# Patient Record
Sex: Male | Born: 1950 | Race: White | Hispanic: No | Marital: Married | Smoking: Never smoker
Health system: Southern US, Community
[De-identification: ages and names within clinical notes are randomized; demographics above are authoritative.]

## PROBLEM LIST (undated history)

## (undated) DIAGNOSIS — K219 Gastro-esophageal reflux disease without esophagitis: Secondary | ICD-10-CM

## (undated) DIAGNOSIS — R51 Headache: Secondary | ICD-10-CM

## (undated) DIAGNOSIS — C801 Malignant (primary) neoplasm, unspecified: Secondary | ICD-10-CM

## (undated) DIAGNOSIS — F329 Major depressive disorder, single episode, unspecified: Secondary | ICD-10-CM

## (undated) DIAGNOSIS — M1990 Unspecified osteoarthritis, unspecified site: Secondary | ICD-10-CM

## (undated) DIAGNOSIS — Z8719 Personal history of other diseases of the digestive system: Secondary | ICD-10-CM

## (undated) DIAGNOSIS — E7800 Pure hypercholesterolemia, unspecified: Secondary | ICD-10-CM

## (undated) DIAGNOSIS — F32A Depression, unspecified: Secondary | ICD-10-CM

## (undated) DIAGNOSIS — K589 Irritable bowel syndrome without diarrhea: Secondary | ICD-10-CM

## (undated) DIAGNOSIS — R519 Headache, unspecified: Secondary | ICD-10-CM

## (undated) DIAGNOSIS — I10 Essential (primary) hypertension: Secondary | ICD-10-CM

## (undated) HISTORY — DX: Irritable bowel syndrome, unspecified: K58.9

## (undated) HISTORY — PX: BACK SURGERY: SHX140

## (undated) HISTORY — DX: Depression, unspecified: F32.A

## (undated) HISTORY — PX: OTHER SURGICAL HISTORY: SHX169

## (undated) HISTORY — DX: Major depressive disorder, single episode, unspecified: F32.9

## (undated) HISTORY — DX: Essential (primary) hypertension: I10

## (undated) HISTORY — PX: SHOULDER SURGERY: SHX246

## (undated) HISTORY — PX: MOLE REMOVAL: SHX2046

## (undated) HISTORY — DX: Headache, unspecified: R51.9

## (undated) HISTORY — PX: CERVICAL DISC SURGERY: SHX588

## (undated) HISTORY — DX: Pure hypercholesterolemia, unspecified: E78.00

## (undated) HISTORY — PX: ANKLE FRACTURE SURGERY: SHX122

## (undated) HISTORY — PX: COLONOSCOPY W/ POLYPECTOMY: SHX1380

## (undated) HISTORY — DX: Headache: R51

---

## 2003-12-23 ENCOUNTER — Ambulatory Visit (HOSPITAL_BASED_OUTPATIENT_CLINIC_OR_DEPARTMENT_OTHER): Admission: RE | Admit: 2003-12-23 | Discharge: 2003-12-23 | Payer: Self-pay | Admitting: Orthopedic Surgery

## 2003-12-23 ENCOUNTER — Ambulatory Visit (HOSPITAL_COMMUNITY): Admission: RE | Admit: 2003-12-23 | Discharge: 2003-12-23 | Payer: Self-pay | Admitting: Orthopedic Surgery

## 2003-12-24 ENCOUNTER — Encounter (INDEPENDENT_AMBULATORY_CARE_PROVIDER_SITE_OTHER): Payer: Self-pay | Admitting: *Deleted

## 2007-06-11 ENCOUNTER — Ambulatory Visit (HOSPITAL_COMMUNITY): Admission: RE | Admit: 2007-06-11 | Discharge: 2007-06-11 | Payer: Self-pay | Admitting: Orthopaedic Surgery

## 2007-12-27 ENCOUNTER — Encounter (INDEPENDENT_AMBULATORY_CARE_PROVIDER_SITE_OTHER): Payer: Self-pay | Admitting: Orthopedic Surgery

## 2007-12-27 ENCOUNTER — Ambulatory Visit (HOSPITAL_BASED_OUTPATIENT_CLINIC_OR_DEPARTMENT_OTHER): Admission: RE | Admit: 2007-12-27 | Discharge: 2007-12-27 | Payer: Self-pay | Admitting: Orthopedic Surgery

## 2009-06-23 IMAGING — CR DG ORBITS FOR FOREIGN BODY
2 series · 2 of 2 positions shown · non-contrast
Comparison: None

CLINICAL DATA: Pre MRI.

ORBITS FOR FOREIGN BODY - 2 VIEW

[w waters * (1 of 2)]
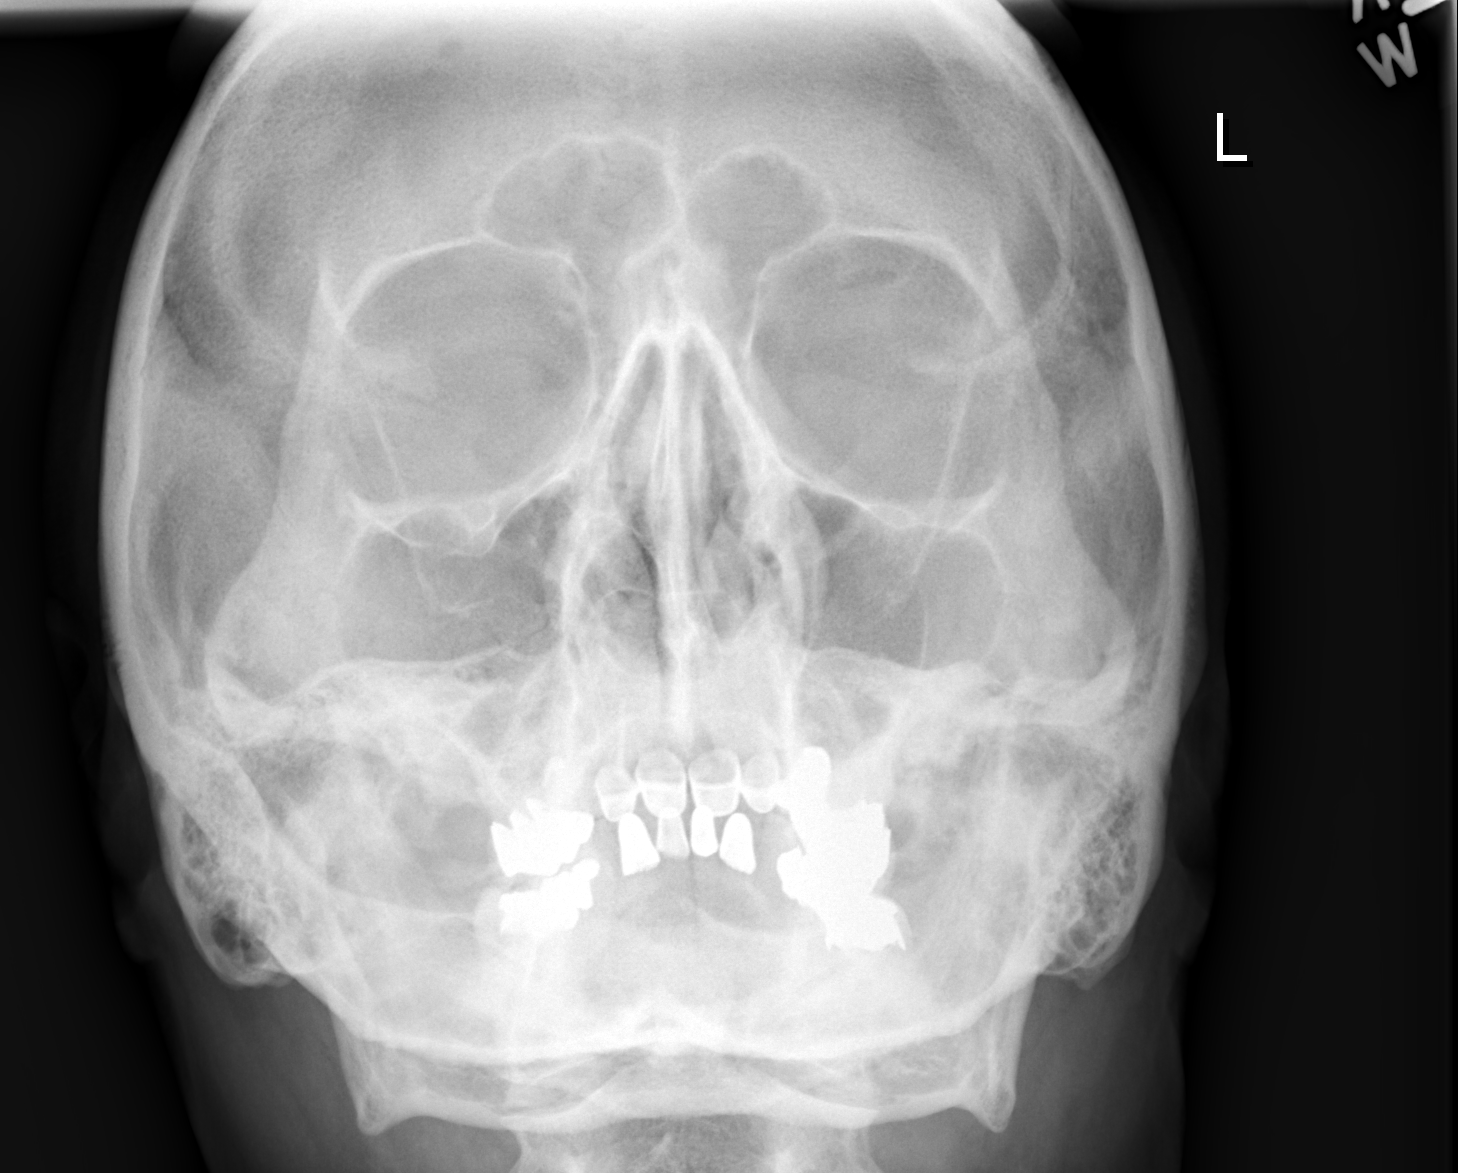

[w waters * (2 of 2)]
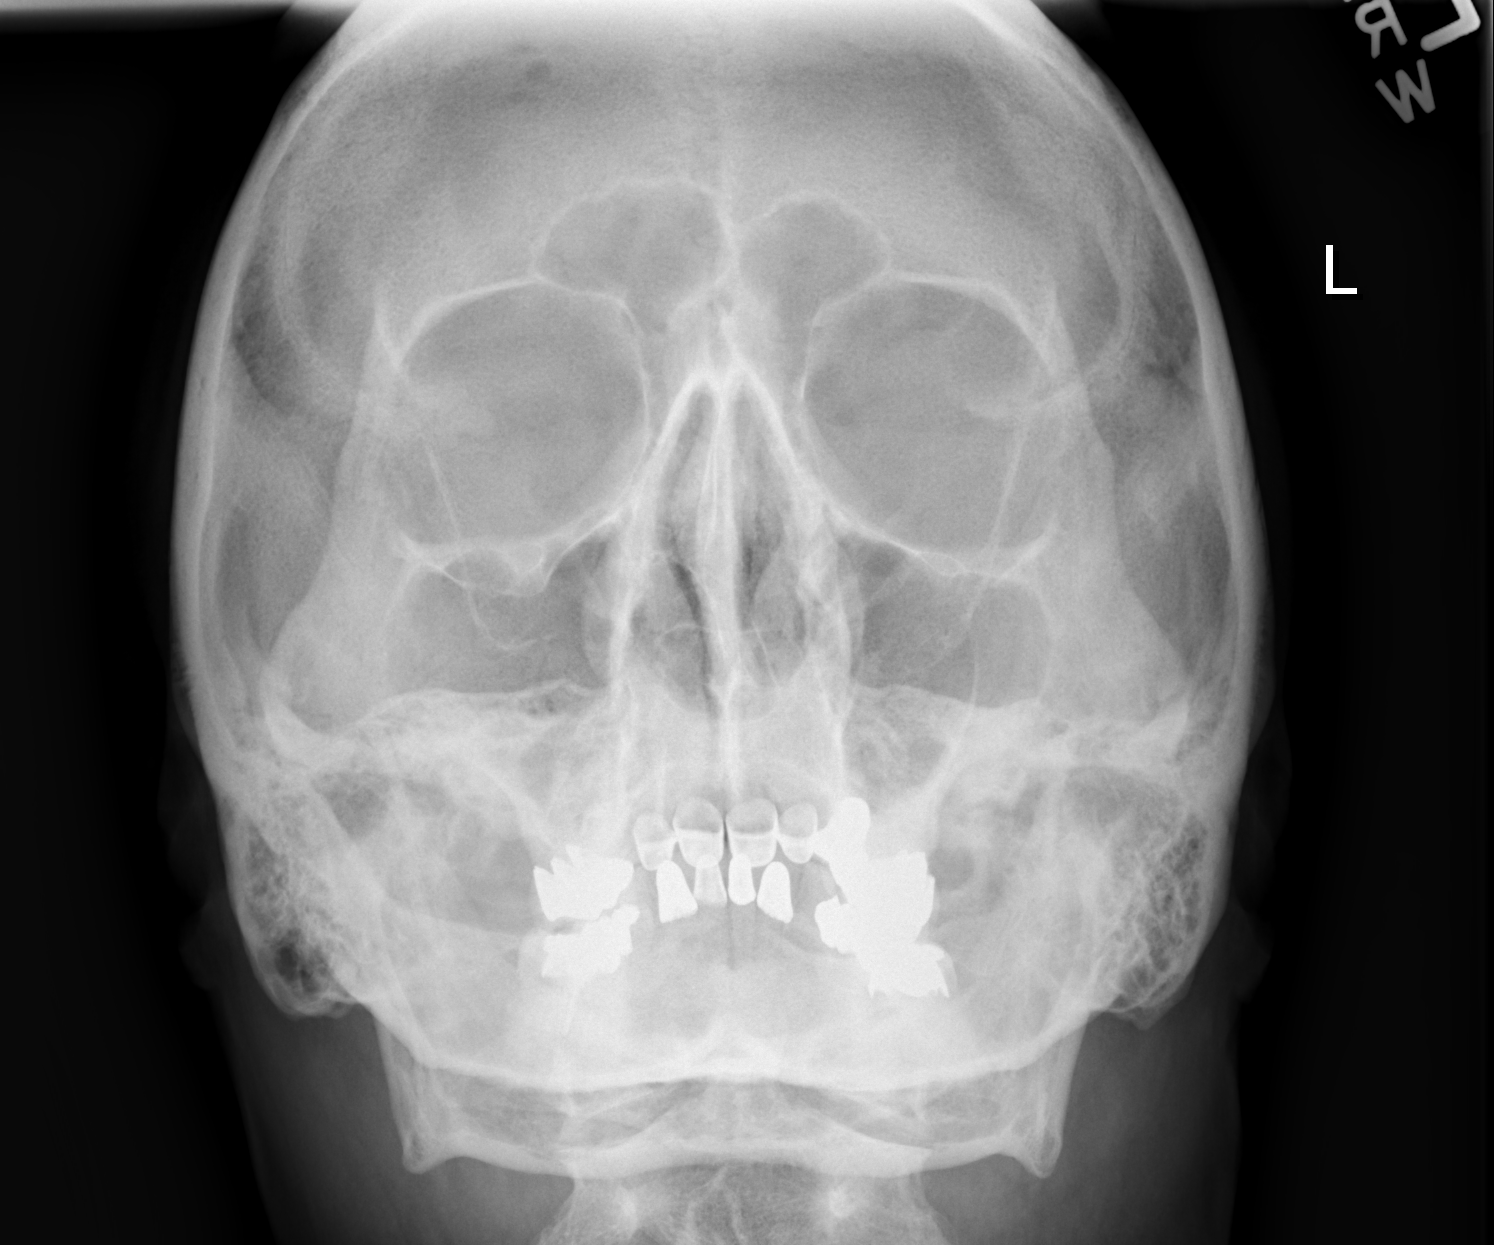

[2 of 2 positions shown; findings below may reference images not displayed]

FINDINGS: No metallic foreign bodies are seen overlying the globes.
The paranasal sinuses are clear.
IMPRESSION: 1.  Negative orbits for metallic foreign body.

## 2009-10-20 ENCOUNTER — Ambulatory Visit (HOSPITAL_BASED_OUTPATIENT_CLINIC_OR_DEPARTMENT_OTHER): Admission: RE | Admit: 2009-10-20 | Discharge: 2009-10-20 | Payer: Self-pay | Admitting: Orthopaedic Surgery

## 2010-04-08 LAB — POCT I-STAT, CHEM 8
BUN: 12 mg/dL (ref 6–23)
Calcium, Ion: 1.16 mmol/L (ref 1.12–1.32)
Creatinine, Ser: 1 mg/dL (ref 0.4–1.5)
Hemoglobin: 15.3 g/dL (ref 13.0–17.0)
Sodium: 141 mEq/L (ref 135–145)
TCO2: 27 mmol/L (ref 0–100)

## 2010-06-08 NOTE — Op Note (Signed)
NAME:  LEVAR, FAYSON NO.:  [id number]   MEDICAL RECORD NO.:  [id number]          PATIENT TYPE:  AMB   LOCATION:  DSC                          FACILITY:  [facility]   PHYSICIAN:  Katy Fitch. Sypher, M.D. 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   POSTOPERATIVE DIAGNOSES:  A 95% loss of hyaline cartilage on right long  finger metacarpal head and 90% loss of hyaline articular cartilage on  base articular surface of proximal phalanx with multiple marginal  osteophytes, evidencing end-stage degenerative arthritis, right long  finger metacarpophalangeal joint.   OPERATION:  Implant arthroplasty of right long finger  metacarpophalangeal joint utilizing a size 5 [employer] flexible  hinge implant arthroplasty and formal reconstruction of the radial  collateral ligament with FiberWire suture through drill holes in the  metacarpal neck.   OPERATING SURGEON:  Katy Fitch. Sypher, MD   ASSISTANT:  Jonni Sanger, PA-C (Mr. Dasnoit's assistance with  retraction and positioning of the finger was invaluable during this  procedure and facilitated a safe accomplishment of this arthroplasty  ensuring optimum results).   ANESTHESIA:  General by LMA supplemented by 2% lidocaine field block.   SUPERVISING ANESTHESIOLOGIST:  Guadalupe Maple, MD   INDICATIONS:  Barbera Setters. Osment is a 60 year old right-hand dominant  retired gentleman who still remains very active with a second career.  He has had a history of progressive right hand pain and limitation of  grasp.  Clinical examination revealed signs of swelling in the region of  his right  long finger metacarpophalangeal joint and detailed examination  of his range of motion revealed loss of hyperextension of the right long  finger MP joint of 10 degrees versus 40 degrees of the adjacent index  and long fingers, and loss of further flexion at 60 degrees compared  with the adjacent index and ring fingers of 90 degrees.  Plain films  demonstrated narrowing of the joint space at the right long finger  metacarpophalangeal joint and a significant marginal osteophyte forming  on the dorsal ulnar aspect of the metacarpal head and the ulnar aspect  of the proximal phalangeal base.   Mr. Lutze had tried over-the-counter analgesics, steroid injection,  activity modification, and topical agents all without relief.   He sought an upper extremity orthopedic consult in the summer of 2009  and was advised to consider implant arthroplasty when his pain became  intolerable.   He has reached a stage where his hand function has been significantly  compromised; therefore, he presents for implant arthroplasty of the  right long finger at this time.   Preoperatively, the surgery, aftercare, potential risks and benefits  were explained in detail.  Questions were invited and answered in detail  both in the office and in the holding area.  After informed consent,  he  is brought to the operating room at this time.   PROCEDURE:  Barbera Setters. Maher was brought to the operating room and  placed in supine position upon the operating table.  Preoperatively, Dr.  Noreene Larsson had had an anesthesia consult and recommended proceeding with  general anesthesia by LMA technique.   He was brought to room 8 of the [facility], placed in supine  position upon the operating table, and under Dr. Morley Kos direct  supervision, general anesthesia by LMA technique induced.   Ancef 1 g was administered as an IV prophylactic antibiotic.  The right  upper extremity was prepped with Betadine soap solution and  sterilely  draped.  Following exsanguination of the right arm with an Esmarch  bandage, arterial tourniquet was inflated to 220 mmHg.  After an  operating room time-out, we proceeded with the reconstructive surgery.   A curvilinear incision was fashioned directly over the right long finger  metacarpophalangeal joint exposing the extensor mechanism.  The extensor  was split in the midline and the capsule of the metacarpophalangeal  joint carefully incised with a 64 Beaver blade.  Full-thickness capsular  flaps were developed radially and ulnarly.  The ulnar collateral  ligament was elevated off the neck of the metacarpal and the radial  collateral ligament was undermined with a sharp osteotome, exposing the  metacarpal neck.   After blunt baby Bennett retractors were placed, the metacarpal head and  neck were resected with an oscillating saw.  Care was taken to build in  approximately 3 degrees of radial deviation.   The metacarpal head was removed and found to have a 95% loss of hyaline  articular cartilage.   The metacarpophalangeal joint was cleared with fine rongeurs including  complete synovectomy and removal of all debris.  The base of the  proximal phalanx was inspected and found to have 90% hyaline cartilage  loss.   After irrigation, the intermedullary canals of the metacarpal and  proximal phalanx were prepared utilizing a sharp bone awl with  radiographic control followed by use of a Swanson power reamer and hand  rasps.  We sequentially prepared the bones to accept a #5 [facility]  flexible hinge implant.   The radial collateral ligament was gathered with a 3-0 FiberWire suture  that was brought through drill holes in the neck of the metacarpal.   A #5 [facility] Silastic hinge implant was placed with no-touch  technique followed by anatomic repair of the capsule utilizing the  through bone sutures for a proximal anchor and multiple interrupted  sutures of 3-0  Ethibond with knots buried.   After irrigation, the extensor mechanism was reconstructed with multiple  figure-of-eight sutures of 3-0 Ethibond with knots buried.   AP and lateral C-arm images were obtained documenting very satisfactory  reconstruction of the metacarpophalangeal joint.  Range of motion was  hyperextension 30 and further flexion 90.   The skin was repaired with segmental intradermal 3-0 Prolene and Steri-  Strips.  Lidocaine 2% was infiltrated as postop analgesia and 30 mg of  Toradol was administered IV as an analgesic by the anesthesia team.   Mr. Keeling was placed in compressive dressing with dorsal and palmar  plaster sandwich splints maintaining the hand in the safe position.  There were no apparent complications.   He was awakened from general anesthesia and transferred to recovery room  with stable vital signs.  Total tourniquet time was 1 hour and 4 minutes  at 220  mmHg.  There were no apparent complications.   For aftercare, he is provided prescriptions of Dilaudid 2 mg 1 or 2  tablets p.o. q.4-6 h. p.r.n. pain, 30 tablets without refill.  Also,  Keflex 500 mg 1 p.o. q.8 h. x4 days as a prophylactic antibiotic.   We will see him back for followup in the office in 1 week for dressing  change, x-ray, and advancement to dynamic splint.      Katy Fitch Sypher, M.D.  Electronically Signed     RVS/MEDQ  D:  12/27/2007  T:  12/28/2007  Job:  [id number]

## 2010-06-10 ENCOUNTER — Other Ambulatory Visit: Payer: Self-pay | Admitting: Family Medicine

## 2010-06-11 NOTE — Op Note (Signed)
NAME:  Joshua Alvarado, Joshua Alvarado NO.:  [id number]   MEDICAL RECORD NO.:  [id number]          PATIENT TYPE:  AMB   LOCATION:  DSC                          FACILITY:  [facility]   PHYSICIAN:  Cindee Salt, M.D.       DATE OF BIRTH:  04-11-1950   DATE OF PROCEDURE:  12/23/2003  DATE OF DISCHARGE:                                 OPERATIVE REPORT   PREOPERATIVE DIAGNOSIS:  Lesion right index finger.   POSTOPERATIVE DIAGNOSIS:  Lesion right index finger.   OPERATION:  Incision and drainage, biopsy of lesion, right index finger.   SURGEON:  Cindee Salt, M.D.   ASSISTANTCarolyne Fiscal   ANESTHESIA:  Forearm base, IV regional.   HISTORY:  The patient is a 60 year old male with a history of a lesion on  the dorsal aspect of his right index finger.  This has been painful for him.  He has hit it on several occasions.  X-rays reveal degenerative arthritis,  questionable cyt is present.   DESCRIPTION OF PROCEDURE:  The patient was brought to the operating room  where a forearm based IV regional anesthetic was carried out without  difficulty.  He was prepped using DuraPrep in a supine position with the  right arm free.  A curvilinear incision was made based over the distal  aspect of the mass and carried down through the subcutaneous tissue.  Multiloculated skin lesion only was identified.  Cultures were then taken.  No cyst was identified.  The joint was not involved.  A biopsy of the skin  was taken, cultures were sent for aerobic, anaerobic, fungal and acid fast.  The wound was irrigated.  The skin was closed with interrupted 5-0 nylon  sutures.  A sterile compressive dressing and splint to the finger were  applied.  The patient tolerated the procedure well.  She is discharged home  to return to the [facility] in one week on Vicodin and  Doxycycline.       GK/MEDQ  D:  12/23/2003  T:  12/23/2003  Job:  [id number]

## 2010-06-29 ENCOUNTER — Other Ambulatory Visit: Payer: Self-pay | Admitting: Family Medicine

## 2010-06-29 ENCOUNTER — Ambulatory Visit
Admission: RE | Admit: 2010-06-29 | Discharge: 2010-06-29 | Disposition: A | Discharge status: Home / Self Care | Payer: BC Managed Care – PPO | Source: Ambulatory Visit | Attending: Family Medicine | Admitting: Family Medicine

## 2010-10-29 LAB — BASIC METABOLIC PANEL
BUN: 10 mg/dL (ref 6–23)
CO2: 28 mEq/L (ref 19–32)
Calcium: 9.2 mg/dL (ref 8.4–10.5)
Chloride: 101 mEq/L (ref 96–112)
Creatinine, Ser: 1.12 mg/dL (ref 0.4–1.5)
Glucose, Bld: 175 mg/dL — ABNORMAL HIGH (ref 70–99)

## 2012-05-30 ENCOUNTER — Other Ambulatory Visit: Payer: Self-pay | Admitting: Dermatology

## 2013-07-10 ENCOUNTER — Other Ambulatory Visit: Payer: Self-pay | Admitting: Gastroenterology

## 2016-06-29 ENCOUNTER — Other Ambulatory Visit: Payer: Self-pay | Admitting: Specialist

## 2016-06-29 DIAGNOSIS — R519 Headache, unspecified: Secondary | ICD-10-CM

## 2016-06-29 DIAGNOSIS — R51 Headache: Principal | ICD-10-CM

## 2016-07-11 ENCOUNTER — Ambulatory Visit
Admission: RE | Admit: 2016-07-11 | Discharge: 2016-07-11 | Disposition: A | Discharge status: Home / Self Care | Payer: Medicare Other | Source: Ambulatory Visit | Attending: Specialist | Admitting: Specialist

## 2016-07-11 DIAGNOSIS — R51 Headache: Principal | ICD-10-CM

## 2016-07-11 DIAGNOSIS — R519 Headache, unspecified: Secondary | ICD-10-CM

## 2016-08-30 ENCOUNTER — Ambulatory Visit
Admission: RE | Admit: 2016-08-30 | Discharge: 2016-08-30 | Disposition: A | Discharge status: Home / Self Care | Payer: Medicare Other | Source: Ambulatory Visit | Attending: Specialist | Admitting: Specialist

## 2016-08-30 MED ORDER — GADOBENATE DIMEGLUMINE 529 MG/ML IV SOLN
20.0000 mL | Freq: Once | INTRAVENOUS | Status: AC | PRN
  Administered 2016-08-30: 20 mL via INTRAVENOUS

## 2016-10-21 ENCOUNTER — Other Ambulatory Visit: Payer: Self-pay | Admitting: Specialist

## 2016-10-21 DIAGNOSIS — R519 Headache, unspecified: Secondary | ICD-10-CM

## 2016-10-21 DIAGNOSIS — R51 Headache: Principal | ICD-10-CM

## 2016-11-03 ENCOUNTER — Ambulatory Visit
Admission: RE | Admit: 2016-11-03 | Discharge: 2016-11-03 | Disposition: A | Discharge status: Home / Self Care | Payer: Medicare Other | Source: Ambulatory Visit | Attending: Specialist | Admitting: Specialist

## 2016-11-03 DIAGNOSIS — R519 Headache, unspecified: Secondary | ICD-10-CM

## 2016-11-03 DIAGNOSIS — R51 Headache: Principal | ICD-10-CM

## 2016-11-03 NOTE — Progress Notes (Signed)
One SST tube of blood drawn from left [facility] space for LP labs; site unremarkable.

## 2016-11-03 NOTE — Discharge Instructions (Signed)

## 2016-11-08 ENCOUNTER — Encounter: Payer: Self-pay | Admitting: Neurology

## 2016-11-09 LAB — CSF CELL COUNT WITH DIFFERENTIAL
RBC COUNT CSF: 1250 {cells}/uL — AB (ref 0–10)
WBC CSF: 2 {cells}/uL (ref 0–5)

## 2016-11-09 LAB — B. BURGDORFI ANTIBODIES, CSF: LYME AB: NEGATIVE

## 2016-11-09 LAB — CRYPTOCOCCAL AG, LTX SCR RFLX TITER
CRYPTOCOCCAL AG SCREEN: NOT DETECTED
MICRO NUMBER: 81134602
SPECIMEN QUALITY: ADEQUATE

## 2016-11-09 LAB — ANGIOTENSIN CONVERTING ENZYME, CSF: ACE, CSF: 12 U/L (ref <=15)

## 2016-11-09 LAB — GLUCOSE, CSF: GLUCOSE CSF: 78 mg/dL (ref 40–80)

## 2016-11-09 LAB — HERPES SIMPLEX VIRUS 1/2 (IGG), CSF: HSV 1 IGG INDEX: 1.12 — AB

## 2016-11-09 LAB — VDRL, CSF: VDRL Quant, CSF: NONREACTIVE

## 2016-11-09 LAB — PROTEIN, CSF: Total Protein, CSF: 99 mg/dL — ABNORMAL HIGH (ref 15–60)

## 2016-12-22 LAB — CSF CULTURE W GRAM STAIN
MICRO NUMBER:: 81134604
SPECIMEN QUALITY:: ADEQUATE

## 2016-12-22 LAB — MYCOBACTERIA,CULT W/FLUOROCHROME SMEAR
MICRO NUMBER:: 81134603
SMEAR: NONE SEEN
SPECIMEN QUALITY:: ADEQUATE

## 2016-12-22 LAB — CSF CULTURE: RESULT: NO GROWTH

## 2017-02-01 ENCOUNTER — Ambulatory Visit: Payer: Medicare Other | Admitting: Neurology

## 2017-02-01 ENCOUNTER — Encounter: Payer: Self-pay | Admitting: Neurology

## 2017-02-01 VITALS — BP 110/80 | HR 79 | Ht 71.5 in | Wt 214.8 lb

## 2017-02-01 DIAGNOSIS — G4452 New daily persistent headache (NDPH): Secondary | ICD-10-CM | POA: Diagnosis not present

## 2017-02-01 DIAGNOSIS — K589 Irritable bowel syndrome without diarrhea: Secondary | ICD-10-CM | POA: Insufficient documentation

## 2017-02-01 DIAGNOSIS — G4483 Primary cough headache: Secondary | ICD-10-CM

## 2017-02-01 DIAGNOSIS — I10 Essential (primary) hypertension: Secondary | ICD-10-CM | POA: Insufficient documentation

## 2017-02-01 DIAGNOSIS — E7800 Pure hypercholesterolemia, unspecified: Secondary | ICD-10-CM | POA: Insufficient documentation

## 2017-02-01 MED ORDER — TOPIRAMATE 25 MG PO TABS
25.0000 mg | ORAL_TABLET | Freq: Two times a day (BID) | ORAL | 2 refills | Status: DC
Start: 2017-02-01 — End: 2017-05-03

## 2017-02-01 NOTE — Patient Instructions (Signed)
1.  I would like you to stop zonisamide.  It is a low dose, so I think it is okay to just stop it. 2.  Instead, start topiramate 25mg  twice daily.    Possible side effects include: impaired thinking, sedation, paresthesias (numbness and tingling) and weight loss.  It may cause dehydration and there is a small risk for kidney stones, so make sure to stay hydrated with water during the day.  There is also a very small risk for glaucoma, so if you notice any change in your vision while taking this medication, see an ophthalmologist.    If you are feeling better at the end of the month, refill the prescription. If not, contact me before you refill it and I will send in a prescription for a higher dose.  3.  Limit use of ibuprofen to no more than 2 days out of the week to prevent rebound headache. 4.  Check CTA of head and neck. 5.  Follow up in 3 months.

## 2017-02-01 NOTE — Progress Notes (Signed)
NEUROLOGY CONSULTATION NOTE  Joshua Alvarado MRN: [id number] DOB: 1950/08/15  Referring provider: Lennie Odor, PA-C Primary care provider: Lennie Odor, PA-C  Reason for consult:  headache  HISTORY OF PRESENT ILLNESS: Joshua Alvarado is a 67 year old male with hypertension, hypercholesterolemia, impaired glucose tolerance, depression and IBS who presents for headache.  He is accompanied by his wife who supplements history.  Headaches started on September 22 or 23, 2017.  He denies any preceding event that may have triggered this headache.  Initially, it was right periorbital that subsequently involved the entire head, radiating from the back to front.  He has a constant dull holocephalic headache.  Bending over makes it worse.  There is no neck pain.  However, he has severe intermittent headaches.  They occasionally occur spontaneously, but almost always are triggered by coughing or sneezing.  When it occurs, he needs to lay down and it resolves in 1 to 2 hours.  He has associated photophobia but no nausea, vomiting, phonophobia or visual disturbance.  It occurs approximately 5 to 6 days a month.  Headache had actually resolved for 2 days following a lumbar puncture.  Past NSAIDS:  Indomethacin trial was ineffective. Past analgesics:  acetaminophen, BC/Goody powder, Excedrin Past abortive triptans:  no Past muscle relaxants:  cyclobenzaprine Past anti-emetic:  no Past antihypertensive medications: metoprolol Past antidepressant medications:  bupropion, escitalopram Past anticonvulsant medications:  no Past vitamins/Herbal/Supplements:  no Past antihistamines/decongestants:  no Other past therapies:  trigger point injections and nerve blocks were ineffective.  Current NSAIDS:  Ibuprofen (takes more than 2 days a week). Current analgesics:  no Current triptans:  no Current anti-emetic:  no Current muscle relaxants:  no Current anti-anxiolytic:  no Current sleep aide:   no Current Antihypertensive medications:  Lisinopril-HCTZ Current Antidepressant medications:  no Current Anticonvulsant medications:  zonisamide 200mg  Current Vitamins/Herbal/Supplements:  no Current Antihistamines/Decongestants:  no Other therapy:  no  Caffeine:  Decaf tea Alcohol:  occasionally Smoker:  no Diet:  Hydrates with water.   Exercise:  Not routine Depression/anxiety:  Some depression related to his headaches Sleep hygiene:  Ok Occasional tension type headaches prior to this, but nothing significant. Family history of headache:  no  He underwent headache workup in 2018: MRI of brain with and without contrast from 08/30/16 was personally reviewed and demonstrated scattered nonspecific hyperintense foci in the cerebral white matter.  He underwent lumbar puncture on 11/03/16 to assess for low intracranial pressure.  Opening pressure of 13.5 cm H2O and closing pressure of 7 cm H2O.  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  PAST MEDICAL HISTORY: Past Medical History:  Diagnosis Date  . Depression   . Headache   . Hypercholesteremia   . Hypertension   . Irritable bowel syndrome     PAST SURGICAL HISTORY: None  MEDICATIONS: Current Outpatient Medications on File Prior to Visit  Medication Sig Dispense Refill  . cholestyramine (QUESTRAN) 4 GM/DOSE powder Take 4 g by mouth 2 (two) times daily with a meal.    . Eluxadoline (  VIBERZI) 75 MG TABS Take 1 tablet by mouth 2 (two) times daily.    Marland Kitchen ezetimibe (ZETIA) 10 MG tablet Take 10  mg by mouth daily.    . sildenafil (REVATIO) 20 MG tablet Take 20 mg by mouth 5 (five) times daily as needed.    Marland Kitchen lisinopril-hydrochlorothiazide (PRINZIDE,ZESTORETIC) 20-25 MG tablet Take 1 tablet by mouth daily.     No current facility-administered medications on file prior to visit.     ALLERGIES: Allergies  Allergen Reactions  . Crestor [Rosuvastatin Calcium]     diarrhea  . Lipitor [Atorvastatin]     Elevated liver enzymes  . Zocor [Simvastatin]     Stomach cramps    FAMILY HISTORY: Family History  Problem Relation Age of Onset  . Cancer Mother   . Parkinson's disease Father     SOCIAL HISTORY: Social History   Socioeconomic History  . Marital status: Married    Spouse name: Not on file  . Number of children: 2  . Years of education: Not on file  . Highest education level: 12th grade  Social Needs  . Financial resource strain: Not on file  . Food insecurity - worry: Not on file  . Food insecurity - inability: Not on file  . Transportation needs - medical: Not on file  . Transportation needs - non-medical: Not on file  Occupational History  . Occupation: retired    Comment: [employer]  Tobacco Use  . Smoking status: Never Smoker  . Smokeless tobacco: Former Systems developer    Types: Chew  Substance and Sexual Activity  . Alcohol use: Yes  . Drug use: No  . Sexual activity: Not on file  Other Topics Concern  . Not on file  Social History Narrative   Married, lives with wife in a one story home.     REVIEW OF SYSTEMS: Constitutional: No fevers, chills, or sweats, no generalized fatigue, change in appetite Eyes: No visual changes, double vision, eye pain Ear, nose and throat: No hearing loss, ear pain, nasal congestion, sore throat Cardiovascular: No chest pain, palpitations Respiratory:  No shortness of breath at rest or with exertion, wheezes GastrointestinaI: No nausea, vomiting, diarrhea, abdominal pain, fecal incontinence Genitourinary:  No dysuria, urinary  retention or frequency Musculoskeletal:  No neck pain, back pain Integumentary: No rash, pruritus, skin lesions Neurological: as above Psychiatric: No depression, insomnia, anxiety Endocrine: No palpitations, fatigue, diaphoresis, mood swings, change in appetite, change in weight, increased thirst Hematologic/Lymphatic:  No purpura, petechiae. Allergic/Immunologic: no itchy/runny eyes, nasal congestion, recent allergic reactions, rashes  PHYSICAL EXAM: Vitals:   02/01/17 1309  BP: 110/80  Pulse: 79  SpO2: 97%   General: No acute distress.  Patient appears well-groomed.  Head:  Normocephalic/atraumatic Eyes:  fundi examined but not visualized Neck: supple, no paraspinal tenderness, full range of motion Back: No paraspinal tenderness Heart: regular rate and rhythm Lungs: Clear to auscultation bilaterally. Vascular: No carotid bruits. Neurological Exam: Mental status: alert and oriented to person, place, and time, recent and remote memory intact, fund of knowledge intact, attention and concentration intact, speech fluent and not dysarthric, language intact. Cranial nerves: CN I: not tested CN II: pupils equal, round and reactive to light, visual fields intact CN III, IV, VI:  full range of motion, no nystagmus, no ptosis CN V: facial sensation intact CN VII: upper and lower face symmetric CN VIII: hearing intact CN IX, X: gag intact, uvula midline CN XI: sternocleidomastoid and trapezius muscles intact CN XII: tongue midline  Bulk & Tone: normal, no fasciculations. Motor:  5/5 throughout  Sensation: temperature and vibration sensation intact. Deep Tendon Reflexes:  2+ throughout, toes downgoing.  Finger to nose testing:  Without dysmetria.  Heel to shin:  Without dysmetria.  Gait:  Normal station and stride.  Able to turn and tandem walk. Romberg negative.  IMPRESSION: 1.  Primary cough headache.  Resolution of headache following LP is suggestive of this diagnosis as  well. 2.  New daily persistent headache, may be complicated by medication overuse.  PLAN: 1.  I will switch him from zonisamide to topiramate, starting at 25mg  twice daily.  We can titrate up in 4 weeks if needed.  Side effects discussed. 2.  Limit use of ibuprofen to no more than 2 days out of the week to prevent rebound headache. 3.  To continue workup of secondary causes, will check CTA of head and neck to evaluate for any cerebral aneurysms or other potential intracranial/extracranial vascular etiology.  Thank you for allowing me to take part in the care of this patient.  Metta Clines, DO  CC:  Lennie Odor, PA-C

## 2017-02-13 ENCOUNTER — Telehealth: Payer: Self-pay | Admitting: Neurology

## 2017-02-13 NOTE — Telephone Encounter (Signed)
Attempted to reach Pt, no answer, LM on VM for rtrn call

## 2017-02-13 NOTE — Telephone Encounter (Signed)
Patient called regarding his Topiramate medication. He said it is not working. Please call. Thanks

## 2017-02-13 NOTE — Telephone Encounter (Signed)
Pt called back. Confirmed he was taking the topiramate 25mg  BID and limiting the ibuprofen to no more than twice a week. Advsd Pt we need to wait at least 4 weeks before increasing or changing medication, and to try and be patient and give the topiramate time to work.  Reminded him to stay hydrated and to call us back after the 4 weeks if no better and we will discuss increase or other options. Pt verbalized understanding.

## 2017-02-16 ENCOUNTER — Ambulatory Visit
Admission: RE | Admit: 2017-02-16 | Discharge: 2017-02-16 | Disposition: A | Discharge status: Home / Self Care | Payer: Medicare Other | Source: Ambulatory Visit | Attending: Neurology | Admitting: Neurology

## 2017-02-16 DIAGNOSIS — G4452 New daily persistent headache (NDPH): Secondary | ICD-10-CM

## 2017-02-16 DIAGNOSIS — G4483 Primary cough headache: Secondary | ICD-10-CM

## 2017-02-16 MED ORDER — IOPAMIDOL (ISOVUE-370) INJECTION 76%
75.0000 mL | Freq: Once | INTRAVENOUS | Status: AC | PRN
  Administered 2017-02-16: 75 mL via INTRAVENOUS

## 2017-02-17 ENCOUNTER — Telehealth: Payer: Self-pay

## 2017-02-17 ENCOUNTER — Ambulatory Visit: Payer: Medicare Other | Admitting: Neurology

## 2017-02-17 NOTE — Telephone Encounter (Signed)
-----   Message from Pieter Partridge, DO sent at 02/17/2017  2:02 PM EST ----- CTA of head and neck looks okay

## 2017-02-17 NOTE — Telephone Encounter (Signed)
Called and spoke with wife , [name]. Advsd her of CTA head/neck results. She will give results to Pt.

## 2017-02-28 ENCOUNTER — Telehealth: Payer: Self-pay | Admitting: Neurology

## 2017-02-28 MED ORDER — TOPIRAMATE 25 MG PO TABS: 25.0000 mg | ORAL_TABLET | ORAL | 3 refills | Status: DC

## 2017-02-28 NOTE — Telephone Encounter (Signed)
Called and spoke with Pt, advsd to increase bedtime dose to 50 mg. Pt verbalized understanding.

## 2017-02-28 NOTE — Telephone Encounter (Signed)
Patient called 30 days after being on Topiramate. He said he is not doing well on it. Please Call. Thanks

## 2017-02-28 NOTE — Telephone Encounter (Signed)
I would increase topiramate to 25mg  in AM and 50mg  at bedtime.  We can increase dose again in 30 days if needed.

## 2017-02-28 NOTE — Telephone Encounter (Signed)
Called and spoke with Pt, he called also on 02/13/17 about topiramate not really helping headaches. He states that headache intensity has improved somewhat, although is still experiencing daily headaches. He is taking 25 mg BID.

## 2017-05-03 ENCOUNTER — Encounter: Payer: Self-pay | Admitting: Neurology

## 2017-05-03 ENCOUNTER — Ambulatory Visit: Payer: Medicare Other | Admitting: Neurology

## 2017-05-03 VITALS — BP 110/68 | HR 86 | Ht 71.5 in | Wt 212.5 lb

## 2017-05-03 DIAGNOSIS — G4483 Primary cough headache: Secondary | ICD-10-CM

## 2017-05-03 MED ORDER — TOPIRAMATE 50 MG PO TABS: 50.0000 mg | ORAL_TABLET | Freq: Two times a day (BID) | ORAL | 2 refills | Status: DC

## 2017-05-03 NOTE — Patient Instructions (Signed)
1.  We will increase topiramate to 50mg  twice daily.  Contact me in 6 weeks and we can increase dose if needed. 2.  Limit use of ibuprofen to no more than 2 days out of week 3.  Follow up in 3 months.

## 2017-05-03 NOTE — Progress Notes (Signed)
NEUROLOGY FOLLOW UP OFFICE NOTE  Joshua Alvarado [id number]  HISTORY OF PRESENT ILLNESS: Joshua Alvarado is a 67 year old male with hypertension, hypercholesterolemia, impaired glucose tolerance, depression and IBS who follows up for primary cough headache.  UPDATE: CTA of head and neck from 02/16/17 were personally reviewed and were unremarkable.  Headaches are improved.  They are still daily but less intense and shorter duration.  He still has a constant nagging headache. Intensity:  moderate Duration:  10 minutes  Frequency:  daily Current NSAIDS:  Ibuprofen (rarely takes). Current analgesics:  no Current triptans:  no Current anti-emetic:  no Current muscle relaxants:  no Current anti-anxiolytic:  no Current sleep aide:  no Current Antihypertensive medications:  Lisinopril-HCTZ Current Antidepressant medications:  no Current Anticonvulsant medications:  topiramate 25mg  in AM and 50mg  in PM Current Vitamins/Herbal/Supplements:  no Current Antihistamines/Decongestants:  no Other therapy:  no   Caffeine:  Decaf tea Alcohol:  occasionally Smoker:  no Diet:  Hydrates with water.   Exercise:  Not routine Depression/anxiety:  Some depression related to his headaches Sleep hygiene:  Ok   HISTORY: Headaches started on September 22 or 23, 2017.  He denies any preceding event that may have triggered this headache.  Initially, it was right periorbital that subsequently involved the entire head, radiating from the back to front.  He has a constant dull holocephalic headache.  Bending over makes it worse.  There is no neck pain.  However, he has severe intermittent headaches.  They occasionally occur spontaneously, but almost always are triggered by coughing or sneezing.  When it occurs, he needs to lay down and it resolves in 1 to 2 hours.  He has associated photophobia but no nausea, vomiting, phonophobia or visual disturbance.  It occurs approximately 5 to 6 days a month.   Headache had actually resolved for 2 days following a lumbar puncture.   Past NSAIDS:  Indomethacin trial was ineffective. Past analgesics:  acetaminophen, BC/Goody powder, Excedrin Past abortive triptans:  no Past muscle relaxants:  cyclobenzaprine Past anti-emetic:  no Past antihypertensive medications: metoprolol Past antidepressant medications:  bupropion, escitalopram Past anticonvulsant medications:  zonisamide 200mg  Past vitamins/Herbal/Supplements:  no Past antihistamines/decongestants:  no Other past therapies:  trigger point injections and nerve blocks were ineffective.   Occasional tension type headaches prior to this, but nothing significant. Family history of headache:  no   He underwent headache workup in 2018: MRI of brain with and without contrast from 08/30/16 was personally reviewed and demonstrated scattered nonspecific hyperintense foci in the cerebral white matter.   He underwent lumbar puncture on 11/03/16 to assess for low intracranial pressure.  Opening pressure of 13.5 cm H2O and closing pressure of 7 cm H2O.  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 Depression   . Headache   . Hypercholesteremia   . Hypertension   . Irritable bowel syndrome     MEDICATIONS: Current Outpatient Medications on File Prior to Visit  Medication Sig Dispense Refill  . cholestyramine (QUESTRAN) 4 GM/DOSE powder Take 4 g by mouth 2 (two) times daily with a meal.    .  Eluxadoline (VIBERZI) 75 MG TABS Take 1 tablet by mouth 2 (two) times daily.    Marland Kitchen ezetimibe (ZETIA) 10 MG tablet Take 10 mg  by mouth daily.    Marland Kitchen lisinopril-hydrochlorothiazide (PRINZIDE,ZESTORETIC) 20-25 MG tablet Take 1 tablet by mouth daily.    . sildenafil (REVATIO) 20 MG tablet Take 20 mg by mouth 5 (five) times daily as needed.     No current facility-administered medications on file prior to visit.     ALLERGIES: Allergies  Allergen Reactions  . Crestor [Rosuvastatin Calcium]     diarrhea  . Lipitor [Atorvastatin]     Elevated liver enzymes  . Zocor [Simvastatin]     Stomach cramps    FAMILY HISTORY: Family History  Problem Relation Age of Onset  . Cancer Mother   . Parkinson's disease Father     SOCIAL HISTORY: Social History   Socioeconomic History  . Marital status: Married    Spouse name: Not on file  . Number of children: 2  . Years of education: Not on file  . Highest education level: 12th grade  Occupational History  . Occupation: retired    Comment: [employer]  Social Needs  . Financial resource strain: Not on file  . Food insecurity:    Worry: Not on file    Inability: Not on file  . Transportation needs:    Medical: Not on file    Non-medical: Not on file  Tobacco Use  . Smoking status: Never Smoker  . Smokeless tobacco: Former Systems developer    Types: Chew  Substance and Sexual Activity  . Alcohol use: Yes  . Drug use: No  . Sexual activity: Not on file  Lifestyle  . Physical activity:    Days per week: Not on file    Minutes per session: Not on file  . Stress: Not on file  Relationships  . Social connections:    Talks on phone: Not on file    Gets together: Not on file    Attends religious service: Not on file    Active member of club or organization: Not on file    Attends meetings of clubs or organizations: Not on file    Relationship status: Not on file  . Intimate partner violence:    Fear of current or ex partner: Not on file    Emotionally abused: Not on file    Physically abused: Not on file    Forced sexual activity: Not on file  Other Topics Concern    . Not on file  Social History Narrative   Married, lives with wife in a one story home.     REVIEW OF SYSTEMS: Constitutional: No fevers, chills, or sweats, no generalized fatigue, change in appetite Eyes: No visual changes, double vision, eye pain Ear, nose and throat: No hearing loss, ear pain, nasal congestion, sore throat Cardiovascular: No chest pain, palpitations Respiratory:  No shortness of breath at rest or with exertion, wheezes GastrointestinaI: No nausea, vomiting, diarrhea, abdominal pain, fecal incontinence Genitourinary:  No dysuria, urinary retention or frequency Musculoskeletal:  No neck pain, back pain Integumentary: No rash, pruritus, skin lesions Neurological: as above Psychiatric: No depression, insomnia, anxiety Endocrine: No palpitations, fatigue, diaphoresis, mood swings, change in appetite, change in weight, increased thirst Hematologic/Lymphatic:  No purpura, petechiae. Allergic/Immunologic: no itchy/runny eyes, nasal congestion, recent allergic reactions, rashes  PHYSICAL EXAM: Vitals:   05/03/17 1410  BP: 110/68  Pulse: 86  SpO2: 97%   General: No acute distress.  Patient appears well-groomed.  normal body habitus. Head:  Normocephalic/atraumatic Eyes:  Fundi examined but not visualized Neck: supple, no paraspinal tenderness, full range of motion Heart:  Regular rate and rhythm Lungs:  Clear to auscultation bilaterally Back: No paraspinal tenderness Neurological Exam: alert and oriented to person, place, and time. Attention span and concentration intact, recent and remote memory intact, fund of knowledge intact.  Speech fluent and not dysarthric, language intact.  CN II-XII intact. Bulk and tone normal, muscle strength 5/5 throughout.  Sensation to light touch  intact.  Deep tendon reflexes 2+ throughout.  Finger to nose testing intact.  Gait normal, Romberg negative.  IMPRESSION: Primary cough headache  PLAN: 1.  Increase topiramate to 50mg   twice daily.  Contact us in 6 weeks with update. 2.  Limit ibuprofen to no more than 2 days out of week 3.  Headache diary 4.  Follow up in 3 months.  Metta Clines, DO  CC: Lennie Odor, PA-C

## 2017-05-22 ENCOUNTER — Encounter: Payer: Self-pay | Admitting: Neurology

## 2017-07-22 ENCOUNTER — Other Ambulatory Visit: Payer: Self-pay | Admitting: Neurology

## 2017-08-16 ENCOUNTER — Encounter: Payer: Self-pay | Admitting: Neurology

## 2017-08-16 ENCOUNTER — Ambulatory Visit: Payer: Medicare Other | Admitting: Neurology

## 2017-08-16 VITALS — BP 102/68 | HR 77 | Ht 71.5 in | Wt 208.0 lb

## 2017-08-16 DIAGNOSIS — G4483 Primary cough headache: Secondary | ICD-10-CM

## 2017-08-16 NOTE — Progress Notes (Signed)
NEUROLOGY FOLLOW UP OFFICE NOTE  Joshua Alvarado [id number]  HISTORY OF PRESENT ILLNESS: Joshua Alvarado is a 67 year old male with hypertension, hypercholesterolemia, impaired glucose tolerance, depression and IBS who follows up for primary cough headache.   UPDATE: Improved.  Intensity:  moderate Duration:  10 minutes  Frequency:  6 over past 6 weeks Current NSAIDS:  Ibuprofen (rarely takes). Current analgesics:  no Current triptans:  no Current anti-emetic:  no Current muscle relaxants:  no Current anti-anxiolytic:  no Current sleep aide:  no Current Antihypertensive medications:  Lisinopril-HCTZ Current Antidepressant medications:  no Current Anticonvulsant medications:  topiramate 50mg  twice daily Current Vitamins/Herbal/Supplements:  no Current Antihistamines/Decongestants:  no Other therapy:  no   Caffeine:  Decaf tea Alcohol:  occasionally Smoker:  no Diet:  Hydrates with water.   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 Sleep hygiene:  Ok   HISTORY: Headaches started on September 22 or 23, 2017.  He denies any preceding event that may have triggered this headache.  Initially, it was right periorbital that subsequently involved the entire head, radiating from the back to front.  He has a constant dull holocephalic headache.  Bending over makes it worse.  There is no neck pain.  However, he has severe intermittent headaches.  They occasionally occur spontaneously, but almost always are triggered by coughing or sneezing.  When it occurs, he needs to lay down and it resolves in 1 to 2 hours.  He has associated photophobia but no nausea, vomiting, phonophobia or visual disturbance.  It occurs approximately 5 to 6 days a month.  Headache had actually resolved for 2 days following a lumbar puncture.   Past NSAIDS:  Indomethacin trial was ineffective. Past analgesics:  acetaminophen, BC/Goody powder, Excedrin Past abortive  triptans:  no Past muscle relaxants:  cyclobenzaprine Past anti-emetic:  no Past antihypertensive medications: metoprolol Past antidepressant medications:  bupropion, escitalopram Past anticonvulsant medications:  zonisamide 200mg  Past vitamins/Herbal/Supplements:  no Past antihistamines/decongestants:  no Other past therapies:  trigger point injections and nerve blocks were ineffective.   Occasional tension type headaches prior to this, but nothing significant. Family history of headache:  no   Workup: I  MRI of brain with and without contrast from 08/30/16 was personally reviewed and demonstrated scattered nonspecific hyperintense foci in the cerebral white matter. II  He underwent lumbar puncture on 11/03/16 to assess for low intracranial pressure.  Opening pressure of 13.5 cm H2O and closing pressure of 7 cm H2O.  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  PAST MEDICAL HISTORY: Past Medical History:  Diagnosis Date  . Depression   . Headache   . Hypercholesteremia   . Hypertension   . Irritable bowel syndrome     MEDICATIONS: Current Outpatient Medications on File Prior to Visit  Medication Sig Dispense Refill  . cholestyramine (QUESTRAN) 4 GM/DOSE powder Take 4 g by mouth 2 (two) times daily with a meal.    . Eluxadoline (VIBERZI) 75 MG TABS Take 1 tablet by mouth 2 (two) times daily.    Marland Kitchen ezetimibe (ZETIA)  10 MG tablet Take 10 mg by mouth daily.    Marland Kitchen lisinopril-hydrochlorothiazide (PRINZIDE,ZESTORETIC) 20-25 MG tablet Take 1 tablet by mouth daily.    .  sildenafil (REVATIO) 20 MG tablet Take 20 mg by mouth 5 (five) times daily as needed.    . topiramate (TOPAMAX) 50 MG tablet TAKE 1 TABLET BY MOUTH TWICE A DAY 60 tablet 2   No current facility-administered medications on file prior to visit.     ALLERGIES: Allergies  Allergen Reactions  . Crestor [Rosuvastatin Calcium]     diarrhea  . Lipitor [Atorvastatin]     Elevated liver enzymes  . Zocor [Simvastatin]     Stomach cramps    FAMILY HISTORY: Family History  Problem Relation Age of Onset  . Cancer Mother   . Parkinson's disease Father     SOCIAL HISTORY: Social History   Socioeconomic History  . Marital status: Married    Spouse name: Not on file  . Number of children: 2  . Years of education: Not on file  . Highest education level: 12th grade  Occupational History  . Occupation: retired    Comment: [employer]  Social Needs  . Financial resource strain: Not on file  . Food insecurity:    Worry: Not on file    Inability: Not on file  . Transportation needs:    Medical: Not on file    Non-medical: Not on file  Tobacco Use  . Smoking status: Never Smoker  . Smokeless tobacco: Former Systems developer    Types: Chew  Substance and Sexual Activity  . Alcohol use: Yes  . Drug use: No  . Sexual activity: Not on file  Lifestyle  . Physical activity:    Days per week: Not on file    Minutes per session: Not on file  . Stress: Not on file  Relationships  . Social connections:    Talks on phone: Not on file    Gets together: Not on file    Attends religious service: Not on file    Active member of club or organization: Not on file    Attends meetings of clubs or organizations: Not on file    Relationship status: Not on file  . Intimate partner violence:    Fear of current or ex partner: Not on file    Emotionally abused: Not on file    Physically abused: Not on file    Forced sexual activity: Not on file  Other Topics Concern  . Not on file  Social History Narrative     Married, lives with wife in a one story home.     REVIEW OF SYSTEMS: Constitutional: No fevers, chills, or sweats, no generalized fatigue, change in appetite Eyes: No visual changes, double vision, eye pain Ear, nose and throat: No hearing loss, ear pain, nasal congestion, sore throat Cardiovascular: No chest pain, palpitations Respiratory:  No shortness of breath at rest or with exertion, wheezes GastrointestinaI: No nausea, vomiting, diarrhea, abdominal pain, fecal incontinence Genitourinary:  No dysuria, urinary retention or frequency Musculoskeletal:  No neck pain, back pain Integumentary: No rash, pruritus, skin lesions Neurological: as above Psychiatric: No depression, insomnia, anxiety Endocrine: No palpitations, fatigue, diaphoresis, mood swings, change in appetite, change in weight, increased thirst Hematologic/Lymphatic:  No purpura, petechiae. Allergic/Immunologic: no itchy/runny eyes, nasal congestion, recent allergic reactions, rashes  PHYSICAL EXAM: Vitals:   08/16/17 1352  BP: 102/68  Pulse: 77  SpO2: 98%   General: No acute distress.  Patient appears  well-groomed.   Head:  Normocephalic/atraumatic Eyes:  Fundi examined but not visualized Neck: supple, no paraspinal tenderness, full range of motion Heart:  Regular rate and rhythm Lungs:  Clear to auscultation bilaterally Back: No paraspinal tenderness Neurological Exam: alert and oriented to person, place, and time. Attention span and concentration intact, recent and remote memory intact, fund of knowledge intact.  Speech fluent and not dysarthric, language intact.  CN II-XII intact. Bulk and tone normal, muscle strength 5/5 throughout.  Sensation to light touch  intact.  Deep tendon reflexes 2+ throughout.  Finger to nose testing intact.  Gait normal, Romberg negative.  IMPRESSION: Primary cough headache  PLAN: 1.  Continue topiramate 50mg  twice daily 2.  Ibuprofen as needed, limited to no more than 2 days  out of week to prevent rebound headache 3.  Headache diary 4.  Follow up in 5 months.  Metta Clines, DO  CC:  Lennie Odor, PA-C

## 2017-08-16 NOTE — Patient Instructions (Signed)
1.  Continue topiramate 50mg  twice daily 2.  Ibuprofen as needed, limited to no more than 2 days out of week to prevent rebound headache 3.  Headache diary 4.  Follow up in 5 months.

## 2017-09-09 ENCOUNTER — Other Ambulatory Visit: Payer: Self-pay | Admitting: Neurology

## 2017-09-11 NOTE — Progress Notes (Signed)
Rcvd after hours phone message. Pt was running out of topiramate 50 mg. He had neglected to request a refill. Dr Posey Pronto authorized [id number]

## 2018-01-21 NOTE — Progress Notes (Signed)
NEUROLOGY FOLLOW UP OFFICE NOTE  Joshua Alvarado [id number]  HISTORY OF PRESENT ILLNESS: Joshua Alvarado is a 67 year old male who follows up for primary cough headaches.  UPDATE: Varies.   Usually 4/10 (sometimes 7/10).  They last 10 minutes.  He may go 6 weeks without an episode or have 6 episodes in one week.  On average, no more than 6 in a month. Current NSAIDS:  none Current analgesics:  none Current triptans:  none Current ergotamine:  none Current anti-emetic:  none Current muscle relaxants:  none Current anti-anxiolytic:  none Current sleep aide:  none Current Antihypertensive medications:  None Current Antidepressant medications:  none Current Anticonvulsant medications:  topiramate 50mg  twice daily Current anti-CGRP:  none Current Vitamins/Herbal/Supplements:  none Current Antihistamines/Decongestants:  none Other therapy:  none  Caffeine:  Decaf tea Alcohol:  occasional Smoker:  no Diet:  Hydrates with water Exercise:  Not routine Depression:  no; Anxiety:  no Other pain:  no Sleep hygiene:  okay  HISTORY: Headaches started on September 22 or 23, 2017.  He denies any preceding event that may have triggered this headache.  Initially, it was right periorbital that subsequently involved the entire head, radiating from the back to front.  He has a constant dull holocephalic headache.  Bending over makes it worse.  There is no neck pain.  However, he has severe intermittent headaches.  They occasionally occur spontaneously, but almost always are triggered by coughing or sneezing.  When it occurs, he needs to lay down and it resolves in 1 to 2 hours. He has associated photophobia but no nausea, vomiting, phonophobia or visual disturbance or unilateral numbness or weakness.  It occurs approximately 5 to 6 days a month.  Headache had actually resolved for 2 days following a lumbar puncture.  Past NSAIDS:  Indomethacin trial was ineffective. Past analgesics:   acetaminophen, BC/Goody powder, Excedrin Past abortive triptans:  no Past muscle relaxants:  cyclobenzaprine Past anti-emetic:  no Past antihypertensive medications: metoprolol Past antidepressant medications:  bupropion, escitalopram Past anticonvulsant medications:  zonisamide 200mg  Past vitamins/Herbal/Supplements:  no Past antihistamines/decongestants:  no Other past therapies:  trigger point injections and nerve blocks were ineffective.  Occasional tension type headaches prior to this, but nothing significant. Family history of headache:  no  Workup: I  MRI of brain with and without contrast from 08/30/16 was personally reviewed and demonstrated scattered nonspecific hyperintense foci in the cerebral white matter. II  He underwent lumbar puncture on 11/03/16 to assess for low intracranial pressure.  Opening pressure of 13.5 cm H2O and closing pressure of 7 cm H2O.  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  PAST MEDICAL HISTORY: Past Medical History:  Diagnosis Date  . Depression   . Headache   . Hypercholesteremia   . Hypertension   . Irritable bowel syndrome     MEDICATIONS: Current Outpatient Medications on File Prior to Visit  Medication Sig Dispense Refill  . cholestyramine (QUESTRAN) 4 GM/DOSE powder Take 4 g by mouth 2 (two) times daily with a meal.    . Eluxadoline (VIBERZI) 75 MG TABS Take  1 tablet by mouth 2 (two) times daily.    Marland Kitchen ezetimibe (ZETIA) 10 MG tablet Take 10 mg by mouth daily.    Marland Kitchen lisinopril-hydrochlorothiazide (PRINZIDE,ZESTORETIC) 20-25  MG tablet Take 1 tablet by mouth daily.    . sildenafil (REVATIO) 20 MG tablet Take 20 mg by mouth 5 (five) times daily as needed.    . topiramate (TOPAMAX) 50 MG tablet TAKE 1 TABLET BY MOUTH TWICE A DAY 60 tablet 2   No current facility-administered medications on file prior to visit.     ALLERGIES: Allergies  Allergen Reactions  . Crestor [Rosuvastatin Calcium]     diarrhea  . Lipitor [Atorvastatin]     Elevated liver enzymes  . Zocor [Simvastatin]     Stomach cramps    FAMILY HISTORY: Family History  Problem Relation Age of Onset  . Cancer Mother   . Parkinson's disease Father    SOCIAL HISTORY: Social History   Socioeconomic History  . Marital status: Married    Spouse name: Not on file  . Number of children: 2  . Years of education: Not on file  . Highest education level: 12th grade  Occupational History  . Occupation: retired    Comment: [employer]  Social Needs  . Financial resource strain: Not on file  . Food insecurity:    Worry: Not on file    Inability: Not on file  . Transportation needs:    Medical: Not on file    Non-medical: Not on file  Tobacco Use  . Smoking status: Never Smoker  . Smokeless tobacco: Former Systems developer    Types: Chew  Substance and Sexual Activity  . Alcohol use: Yes  . Drug use: No  . Sexual activity: Not on file  Lifestyle  . Physical activity:    Days per week: Not on file    Minutes per session: Not on file  . Stress: Not on file  Relationships  . Social connections:    Talks on phone: Not on file    Gets together: Not on file    Attends religious service: Not on file    Active member of club or organization: Not on file    Attends meetings of clubs or organizations: Not on file    Relationship status: Not on file  . Intimate partner violence:    Fear of current or ex partner: Not on file    Emotionally abused: Not on file    Physically abused: Not on file    Forced sexual activity: Not on file  Other Topics Concern   . Not on file  Social History Narrative   Married, lives with wife in a one story home.     REVIEW OF SYSTEMS: Constitutional: No fevers, chills, or sweats, no generalized fatigue, change in appetite Eyes: No visual changes, double vision, eye pain Ear, nose and throat: No hearing loss, ear pain, nasal congestion, sore throat Cardiovascular: No chest pain, palpitations Respiratory:  No shortness of breath at rest or with exertion, wheezes GastrointestinaI: No nausea, vomiting, diarrhea, abdominal pain, fecal incontinence Genitourinary:  No dysuria, urinary retention or frequency Musculoskeletal:  No neck pain, back pain Integumentary: No rash, pruritus, skin lesions Neurological: as above Psychiatric: No depression, insomnia, anxiety Endocrine: No palpitations, fatigue, diaphoresis, mood swings, change in appetite, change in weight, increased thirst Hematologic/Lymphatic:  No purpura, petechiae. Allergic/Immunologic: no itchy/runny eyes, nasal congestion, recent allergic reactions, rashes  PHYSICAL EXAM: Blood pressure (!) 146/82, pulse 83, height 5' 11.5" (1.816 m),  weight 220 lb (99.8 kg), SpO2 98 %. General: No acute distress.  Patient appears well-groomed.   Head:  Normocephalic/atraumatic Eyes:  Fundi examined but not visualized Neck: supple, no paraspinal tenderness, full range of motion Heart:  Regular rate and rhythm Lungs:  Clear to auscultation bilaterally Back: No paraspinal tenderness Neurological Exam: alert and oriented to person, place, and time. Attention span and concentration intact, recent and remote memory intact, fund of knowledge intact.  Speech fluent and not dysarthric, language intact.  CN II-XII intact. Bulk and tone normal, muscle strength 5/5 throughout.  Sensation to light touch  intact.  Deep tendon reflexes 2+ throughout.  Finger to nose testing intact.  Gait normal, Romberg negative.  IMPRESSION: Primary cough headache  PLAN: 1.  Continue  topiramate 50mg  twice daily 2.  Follow up with PCP regarding blood pressure 3.  Follow up in 6 months.  Metta Clines, DO  CC: Lennie Odor, PA-C

## 2018-01-22 ENCOUNTER — Encounter: Payer: Self-pay | Admitting: Neurology

## 2018-01-22 ENCOUNTER — Ambulatory Visit: Payer: Medicare Other | Admitting: Neurology

## 2018-01-22 VITALS — BP 146/82 | HR 83 | Ht 71.5 in | Wt 220.0 lb

## 2018-01-22 DIAGNOSIS — G4483 Primary cough headache: Secondary | ICD-10-CM

## 2018-01-22 NOTE — Patient Instructions (Signed)
1. Continue topiramate 50mg  twice daily 2.  Follow up in 6 months

## 2018-02-22 ENCOUNTER — Other Ambulatory Visit: Payer: Self-pay | Admitting: Orthopaedic Surgery

## 2018-02-26 ENCOUNTER — Other Ambulatory Visit: Payer: Self-pay

## 2018-02-26 ENCOUNTER — Encounter (HOSPITAL_COMMUNITY): Payer: Self-pay | Admitting: *Deleted

## 2018-02-26 NOTE — Anesthesia Preprocedure Evaluation (Addendum)
Anesthesia Evaluation  Patient identified by MRN, date of birth, ID band Patient awake    Reviewed: Allergy & Precautions, NPO status , Patient's Chart, lab work & pertinent test results  History of Anesthesia Complications Negative for: history of anesthetic complications  Airway Mallampati: II  TM Distance: >3 FB Neck ROM: Full    Dental  (+) Teeth Intact, Dental Advisory Given   Pulmonary neg pulmonary ROS,    Pulmonary exam normal breath sounds clear to auscultation       Cardiovascular hypertension, Normal cardiovascular exam Rhythm:Regular Rate:Normal     Neuro/Psych negative neurological ROS     GI/Hepatic Neg liver ROS, hiatal hernia,   Endo/Other  negative endocrine ROS  Renal/GU negative Renal ROS     Musculoskeletal negative musculoskeletal ROS (+)   Abdominal   Peds  Hematology negative hematology ROS (+)   Anesthesia Other Findings Day of surgery medications reviewed with the patient.  Reproductive/Obstetrics                            Anesthesia Physical Anesthesia Plan  ASA: II  Anesthesia Plan: General   Post-op Pain Management: GA combined w/ Regional for post-op pain   Induction: Intravenous  PONV Risk Score and Plan: 2 and Treatment may vary due to age or medical condition, Propofol infusion, Ondansetron and Midazolam  Airway Management Planned: LMA  Additional Equipment:   Intra-op Plan:   Post-operative Plan: Extubation in OR  Informed Consent: I have reviewed the patients History and Physical, chart, labs and discussed the procedure including the risks, benefits and alternatives for the proposed anesthesia with the patient or authorized representative who has indicated his/her understanding and acceptance.     Dental advisory given  Plan Discussed with: CRNA  Anesthesia Plan Comments: (LMA (using thigh tourniquet) + ankle block)        Anesthesia Quick Evaluation

## 2018-02-26 NOTE — Progress Notes (Signed)
Mr Joshua Alvarado denies chest pain, no shortness of breath at rest.  Patient see Red[place] PA-C at [facility].  Mr. Joshua Alvarado has a history of HTN, which is controlled, he takes "Lisinopril-HCTZ maybe 2 times a month."  Mr Joshua Alvarado reports that PCP has told him that he "might be pre- diabetic."  I requested records from PCP.

## 2018-02-27 ENCOUNTER — Ambulatory Visit (HOSPITAL_BASED_OUTPATIENT_CLINIC_OR_DEPARTMENT_OTHER)
Admission: RE | Admit: 2018-02-27 | Discharge: 2018-02-27 | Disposition: A | Discharge status: Home / Self Care | Payer: Medicare Other | Attending: Orthopaedic Surgery | Admitting: Orthopaedic Surgery

## 2018-02-27 ENCOUNTER — Encounter (HOSPITAL_BASED_OUTPATIENT_CLINIC_OR_DEPARTMENT_OTHER): Payer: Self-pay | Admitting: Certified Registered"

## 2018-02-27 ENCOUNTER — Ambulatory Visit (HOSPITAL_BASED_OUTPATIENT_CLINIC_OR_DEPARTMENT_OTHER): Payer: Medicare Other | Admitting: Certified Registered"

## 2018-02-27 ENCOUNTER — Encounter (HOSPITAL_BASED_OUTPATIENT_CLINIC_OR_DEPARTMENT_OTHER)
Admission: RE | Disposition: A | Discharge status: Home / Self Care | Payer: Self-pay | Source: Home / Self Care | Attending: Orthopaedic Surgery

## 2018-02-27 DIAGNOSIS — Z85828 Personal history of other malignant neoplasm of skin: Secondary | ICD-10-CM | POA: Insufficient documentation

## 2018-02-27 DIAGNOSIS — I10 Essential (primary) hypertension: Secondary | ICD-10-CM | POA: Insufficient documentation

## 2018-02-27 DIAGNOSIS — Z79899 Other long term (current) drug therapy: Secondary | ICD-10-CM | POA: Insufficient documentation

## 2018-02-27 DIAGNOSIS — M2021 Hallux rigidus, right foot: Secondary | ICD-10-CM | POA: Diagnosis present

## 2018-02-27 DIAGNOSIS — E7800 Pure hypercholesterolemia, unspecified: Secondary | ICD-10-CM | POA: Insufficient documentation

## 2018-02-27 DIAGNOSIS — K589 Irritable bowel syndrome without diarrhea: Secondary | ICD-10-CM | POA: Diagnosis not present

## 2018-02-27 HISTORY — DX: Personal history of other diseases of the digestive system: Z87.19

## 2018-02-27 HISTORY — DX: Malignant (primary) neoplasm, unspecified: C80.1

## 2018-02-27 HISTORY — PX: ARTHRODESIS METATARSALPHALANGEAL JOINT (MTPJ): SHX6566

## 2018-02-27 SURGERY — FUSION, JOINT, GREAT TOE
Anesthesia: General | Laterality: Right

## 2018-02-27 MED ORDER — OXYCODONE HCL 5 MG PO TABS: 5.0000 mg | ORAL_TABLET | Freq: Once | ORAL | Status: DC | PRN

## 2018-02-27 MED ORDER — OXYCODONE HCL 5 MG PO TABS: 5.0000 mg | ORAL_TABLET | ORAL | 0 refills | Status: AC | PRN

## 2018-02-27 MED ORDER — BUPIVACAINE HCL (PF) 0.5 % IJ SOLN
INTRAMUSCULAR | Status: AC
  Filled 2018-02-27: qty 30

## 2018-02-27 MED ORDER — EPHEDRINE 5 MG/ML INJ
INTRAVENOUS | Status: AC
  Filled 2018-02-27: qty 10

## 2018-02-27 MED ORDER — SCOPOLAMINE 1 MG/3DAYS TD PT72: 1.0000 | MEDICATED_PATCH | Freq: Once | TRANSDERMAL | Status: DC | PRN

## 2018-02-27 MED ORDER — PROPOFOL 500 MG/50ML IV EMUL
INTRAVENOUS | Status: AC
  Filled 2018-02-27: qty 150

## 2018-02-27 MED ORDER — FENTANYL CITRATE (PF) 100 MCG/2ML IJ SOLN: 25.0000 ug | INTRAMUSCULAR | Status: DC | PRN

## 2018-02-27 MED ORDER — LIDOCAINE 2% (20 MG/ML) 5 ML SYRINGE
INTRAMUSCULAR | Status: AC
  Filled 2018-02-27: qty 30

## 2018-02-27 MED ORDER — CEFAZOLIN SODIUM-DEXTROSE 2-4 GM/100ML-% IV SOLN
INTRAVENOUS | Status: AC
  Filled 2018-02-27: qty 100

## 2018-02-27 MED ORDER — CEFAZOLIN SODIUM-DEXTROSE 2-4 GM/100ML-% IV SOLN
2.0000 g | INTRAVENOUS | Status: AC
  Administered 2018-02-27: 2 g via INTRAVENOUS

## 2018-02-27 MED ORDER — MIDAZOLAM HCL 2 MG/2ML IJ SOLN
1.0000 mg | INTRAMUSCULAR | Status: DC | PRN
  Administered 2018-02-27: 1 mg via INTRAVENOUS

## 2018-02-27 MED ORDER — FENTANYL CITRATE (PF) 100 MCG/2ML IJ SOLN
INTRAMUSCULAR | Status: AC
  Filled 2018-02-27: qty 2

## 2018-02-27 MED ORDER — PROPOFOL 10 MG/ML IV BOLUS
INTRAVENOUS | Status: DC | PRN
  Administered 2018-02-27: 200 mg via INTRAVENOUS

## 2018-02-27 MED ORDER — DEXAMETHASONE SODIUM PHOSPHATE 10 MG/ML IJ SOLN
INTRAMUSCULAR | Status: AC
  Filled 2018-02-27: qty 5

## 2018-02-27 MED ORDER — MIDAZOLAM HCL 2 MG/2ML IJ SOLN
INTRAMUSCULAR | Status: AC
  Filled 2018-02-27: qty 2

## 2018-02-27 MED ORDER — BUPIVACAINE HCL (PF) 0.5 % IJ SOLN
INTRAMUSCULAR | Status: DC | PRN
  Administered 2018-02-27: 25 mL

## 2018-02-27 MED ORDER — OXYCODONE HCL 5 MG/5ML PO SOLN: 5.0000 mg | Freq: Once | ORAL | Status: DC | PRN

## 2018-02-27 MED ORDER — POVIDONE-IODINE 10 % EX SWAB: 2.0000 "application " | Freq: Once | CUTANEOUS | Status: DC

## 2018-02-27 MED ORDER — FENTANYL CITRATE (PF) 100 MCG/2ML IJ SOLN
50.0000 ug | INTRAMUSCULAR | Status: DC | PRN
  Administered 2018-02-27: 25 ug via INTRAVENOUS
  Administered 2018-02-27: 100 ug via INTRAVENOUS

## 2018-02-27 MED ORDER — ACETAMINOPHEN 10 MG/ML IV SOLN: 1000.0000 mg | Freq: Once | INTRAVENOUS | Status: DC | PRN

## 2018-02-27 MED ORDER — PROMETHAZINE HCL 25 MG/ML IJ SOLN: 6.2500 mg | INTRAMUSCULAR | Status: DC | PRN

## 2018-02-27 MED ORDER — ONDANSETRON HCL 4 MG/2ML IJ SOLN
INTRAMUSCULAR | Status: AC
  Filled 2018-02-27: qty 16

## 2018-02-27 MED ORDER — DEXAMETHASONE SODIUM PHOSPHATE 10 MG/ML IJ SOLN
INTRAMUSCULAR | Status: DC | PRN
  Administered 2018-02-27: 10 mg via INTRAVENOUS

## 2018-02-27 MED ORDER — PHENYLEPHRINE 40 MCG/ML (10ML) SYRINGE FOR IV PUSH (FOR BLOOD PRESSURE SUPPORT)
PREFILLED_SYRINGE | INTRAVENOUS | Status: AC
  Filled 2018-02-27: qty 10

## 2018-02-27 MED ORDER — LACTATED RINGERS IV SOLN
INTRAVENOUS | Status: DC
  Administered 2018-02-27 (×3): via INTRAVENOUS

## 2018-02-27 MED ORDER — PROPOFOL 500 MG/50ML IV EMUL
INTRAVENOUS | Status: DC | PRN
  Administered 2018-02-27: 25 ug/kg/min via INTRAVENOUS

## 2018-02-27 MED ORDER — 0.9 % SODIUM CHLORIDE (POUR BTL) OPTIME
TOPICAL | Status: DC | PRN
  Administered 2018-02-27: 1000 mL

## 2018-02-27 SURGICAL SUPPLY — 81 items
APL SKNCLS STERI-STRIP NONHPOA (GAUZE/BANDAGES/DRESSINGS)
BANDAGE ACE 4X5 VEL STRL LF (GAUZE/BANDAGES/DRESSINGS) ×2 IMPLANT
BANDAGE ACE 6X5 VEL STRL LF (GAUZE/BANDAGES/DRESSINGS) ×1 IMPLANT
BANDAGE ELASTIC 4 VELCRO ST LF (GAUZE/BANDAGES/DRESSINGS) ×1 IMPLANT
BANDAGE ESMARK 6X9 LF (GAUZE/BANDAGES/DRESSINGS) IMPLANT
BENZOIN TINCTURE PRP APPL 2/3 (GAUZE/BANDAGES/DRESSINGS) IMPLANT
BIT DRILL 2 CANN SM BONE QF (BIT) ×1 IMPLANT
BIT DRILL CANN F/COMP 2.2 (BIT) ×1 IMPLANT
BLADE LONG MED 25X9 (BLADE) ×2 IMPLANT
BLADE OSC/SAG .038X5.5 CUT EDG (BLADE) ×2 IMPLANT
BLADE SURG 15 STRL LF DISP TIS (BLADE) ×2 IMPLANT
BLADE SURG 15 STRL SS (BLADE) ×4
BNDG CMPR 9X4 STRL LF SNTH (GAUZE/BANDAGES/DRESSINGS) ×1
BNDG CMPR 9X6 STRL LF SNTH (GAUZE/BANDAGES/DRESSINGS)
BNDG COHESIVE 4X5 TAN STRL (GAUZE/BANDAGES/DRESSINGS) ×1 IMPLANT
BNDG CONFORM 2 STRL LF (GAUZE/BANDAGES/DRESSINGS) ×2 IMPLANT
BNDG ESMARK 4X9 LF (GAUZE/BANDAGES/DRESSINGS) ×1 IMPLANT
BNDG ESMARK 6X9 LF (GAUZE/BANDAGES/DRESSINGS)
CHLORAPREP W/TINT 26ML (MISCELLANEOUS) ×2 IMPLANT
COVER BACK TABLE 60X90IN (DRAPES) ×2 IMPLANT
COVER WAND RF STERILE (DRAPES) IMPLANT
CUFF TOURNIQUET SINGLE 34IN LL (TOURNIQUET CUFF) ×1 IMPLANT
DECANTER SPIKE VIAL GLASS SM (MISCELLANEOUS) IMPLANT
DRAPE EXTREMITY T 121X128X90 (DISPOSABLE) ×2 IMPLANT
DRAPE IMP U-DRAPE 54X76 (DRAPES) ×2 IMPLANT
DRAPE OEC MINIVIEW 54X84 (DRAPES) ×2 IMPLANT
DRAPE U-SHAPE 47X51 STRL (DRAPES) ×2 IMPLANT
ELECT REM PT RETURN 9FT ADLT (ELECTROSURGICAL) ×2
ELECTRODE REM PT RTRN 9FT ADLT (ELECTROSURGICAL) ×1 IMPLANT
GAUZE SPONGE 4X4 12PLY STRL (GAUZE/BANDAGES/DRESSINGS) ×2 IMPLANT
GAUZE XEROFORM 1X8 LF (GAUZE/BANDAGES/DRESSINGS) ×2 IMPLANT
GLOVE BIO SURGEON STRL SZ7.5 (GLOVE) ×2 IMPLANT
GLOVE BIOGEL PI IND STRL 8 (GLOVE) ×1 IMPLANT
GLOVE BIOGEL PI INDICATOR 8 (GLOVE) ×1
GOWN STRL REUS W/ TWL LRG LVL3 (GOWN DISPOSABLE) ×1 IMPLANT
GOWN STRL REUS W/ TWL XL LVL3 (GOWN DISPOSABLE) ×1 IMPLANT
GOWN STRL REUS W/TWL LRG LVL3 (GOWN DISPOSABLE) ×2
GOWN STRL REUS W/TWL XL LVL3 (GOWN DISPOSABLE) ×2
GUIDEWIRE .045IN 1.14MM (WIRE) ×2 IMPLANT
GUIDEWIRE 1.6 (WIRE) ×2
GUIDEWIRE ORTH 157X1.6XTROC (WIRE) IMPLANT
K-WIRE .035X4 (WIRE) IMPLANT
K-WIRE .045X4 (WIRE) IMPLANT
NEEDLE HYPO 22GX1.5 SAFETY (NEEDLE) IMPLANT
NS IRRIG 1000ML POUR BTL (IV SOLUTION) ×2 IMPLANT
PACK BASIN DAY SURGERY FS (CUSTOM PROCEDURE TRAY) ×2 IMPLANT
PAD CAST 4YDX4 CTTN HI CHSV (CAST SUPPLIES) ×1 IMPLANT
PADDING CAST COTTON 4X4 STRL (CAST SUPPLIES) ×2
PADDING CAST SYNTHETIC 4 (CAST SUPPLIES) ×1
PADDING CAST SYNTHETIC 4X4 STR (CAST SUPPLIES) ×1 IMPLANT
PENCIL BUTTON HOLSTER BLD 10FT (ELECTRODE) ×2 IMPLANT
PLATE MTP LP CONTOURED (Plate) ×1 IMPLANT
REAMER METATARSAL CUP 20MM ×1 IMPLANT
REAMER PHALANGEAL CONE 20MM ×1 IMPLANT
SCREW CORTICAL 3MMX18MM (Screw) ×1 IMPLANT
SCREW LOCKING 3MMX18MM (Screw) ×2 IMPLANT
SCREW LP CORT 3.0X20 (Screw) ×1 IMPLANT
SCREW LP CORT 3.0X24 (ELECTROSURGICAL) ×1 IMPLANT
SCREW LP LOCKING 3X20MM (Screw) ×2 IMPLANT
SCREW QUICK FIX 3.0X36 (Screw) ×1 IMPLANT
SLEEVE SCD COMPRESS KNEE MED (MISCELLANEOUS) ×2 IMPLANT
SPLINT FIBERGLASS 4X30 (CAST SUPPLIES) IMPLANT
SPONGE LAP 18X18 RF (DISPOSABLE) ×1 IMPLANT
STOCKINETTE 6  STRL (DRAPES) ×1
STOCKINETTE 6 STRL (DRAPES) ×1 IMPLANT
STRIP CLOSURE SKIN 1/2X4 (GAUZE/BANDAGES/DRESSINGS) IMPLANT
SUCTION FRAZIER HANDLE 10FR (MISCELLANEOUS) ×1
SUCTION TUBE FRAZIER 10FR DISP (MISCELLANEOUS) ×1 IMPLANT
SUT ETHILON 3 0 PS 1 (SUTURE) ×2 IMPLANT
SUT FIBERWIRE 2-0 18 17.9 3/8 (SUTURE)
SUT MNCRL AB 3-0 PS2 18 (SUTURE) ×2 IMPLANT
SUT PDS AB 2-0 CT2 27 (SUTURE) ×2 IMPLANT
SUT VIC AB 2-0 SH 27 (SUTURE)
SUT VIC AB 2-0 SH 27XBRD (SUTURE) IMPLANT
SUT VIC AB 3-0 FS2 27 (SUTURE) IMPLANT
SUTURE FIBERWR 2-0 18 17.9 3/8 (SUTURE) IMPLANT
SYR BULB 3OZ (MISCELLANEOUS) ×2 IMPLANT
SYR CONTROL 10ML LL (SYRINGE) IMPLANT
TOWEL GREEN STERILE FF (TOWEL DISPOSABLE) ×4 IMPLANT
TUBE CONNECTING 20X1/4 (TUBING) ×2 IMPLANT
UNDERPAD 30X30 (UNDERPADS AND DIAPERS) ×2 IMPLANT

## 2018-02-27 NOTE — Anesthesia Procedure Notes (Signed)
Procedure Name: LMA Insertion Date/Time: 02/27/2018 9:51 AM Performed by: Signe Colt, CRNA Pre-anesthesia Checklist: Patient identified, Emergency Drugs available, Suction available and Patient being monitored Patient Re-evaluated:Patient Re-evaluated prior to induction Oxygen Delivery Method: Circle system utilized Preoxygenation: Pre-oxygenation with 100% oxygen Induction Type: IV induction Ventilation: Mask ventilation without difficulty LMA: LMA inserted LMA Size: 4.0 Number of attempts: 1 Airway Equipment and Method: Bite block Placement Confirmation: positive ETCO2 Tube secured with: Tape Dental Injury: Teeth and Oropharynx as per pre-operative assessment

## 2018-02-27 NOTE — Progress Notes (Signed)
Assisted Dr. Howze with right, ultrasound guided, ankle block. Side rails up, monitors on throughout procedure. See vital signs in flow sheet. Tolerated Procedure well. 

## 2018-02-27 NOTE — Anesthesia Procedure Notes (Signed)
Anesthesia Regional Block: Ankle block   Pre-Anesthetic Checklist: ,, timeout performed, Correct Patient, Correct Site, Correct Laterality, Correct Procedure, Correct Position, site marked, Risks and benefits discussed, pre-op evaluation,  At surgeon's request and post-op pain management  Laterality: Right  Prep: Maximum Sterile Barrier Precautions used, chloraprep       Needles:  Injection technique: Single-shot  Needle Type: Echogenic Needle     Needle Length: 4cm  Needle Gauge: 25     Additional Needles:   Procedures:,,,, ultrasound used (permanent image in chart),,,,  Narrative:  Start time: 02/27/2018 8:31 AM End time: 02/27/2018 8:35 AM Injection made incrementally with aspirations every 5 mL.  Performed by: Personally  Anesthesiologist: Brennan Bailey, MD  Additional Notes: Risks, benefits, and alternative discussed. Patient gave consent for procedure. Patient prepped and draped in sterile fashion. Sedation administered, patient remains easily responsive to voice. Relevant anatomy identified with ultrasound guidance. Local anesthetic given in 5cc increments with no signs or symptoms of intravascular injection. No pain or paraesthesias with injection. Patient monitored throughout procedure with signs of LAST or immediate complications. Tolerated well. Ultrasound image placed in chart.  Tawny Asal, MD

## 2018-02-27 NOTE — Transfer of Care (Signed)
Immediate Anesthesia Transfer of Care Note  Patient: Joshua Alvarado  Procedure(s) Performed: RIGHT 1ST METATARSOPHALANGEAL ARTHRODESIS (Right )  Patient Location: PACU  Anesthesia Type:GA combined with regional for post-op pain  Level of Consciousness: drowsy and patient cooperative  Airway & Oxygen Therapy: Patient Spontanous Breathing and Patient connected to face mask oxygen  Post-op Assessment: Report given to RN and Post -op Vital signs reviewed and stable  Post vital signs: Reviewed and stable  Last Vitals:  Vitals Value Taken Time  BP    Temp    Pulse 71 02/27/2018 11:11 AM  Resp 9 02/27/2018 11:11 AM  SpO2 97 % 02/27/2018 11:11 AM  Vitals shown include unvalidated device data.  Last Pain:  Vitals:   02/27/18 0821  TempSrc: Oral  PainSc: 3       Patients Stated Pain Goal: 3 (75/10/25 8527)  Complications: No apparent anesthesia complications

## 2018-02-27 NOTE — H&P (Signed)
Joshua Alvarado is an 68 y.o. male.   Chief Complaint: Right hallux rigidus HPI: Patient doing with pain in his right hallux MTP joint for several months.  He noted bony prominence that is painful with shoe wear.  He is tried shoe modification as well as carbon fiber insert as well and anti-inflammatories.  Given the bony prominence and activity limitations he is indicated for first MTP arthrodesis.  Patient is here today for surgery.  He continues to have pain in the right hallux MTP joint.  Denies any recent fevers or chills.  Denies any other joint or extremity pain today.  Past Medical History:  Diagnosis Date  . Cancer (HCC)    squamous right ear  . Depression   . Headache   . History of hiatal hernia    " a long time ago - it does't bother me anymore."  . Hypercholesteremia   . Hypertension    no BP meds now  . Irritable bowel syndrome     Past Surgical History:  Procedure Laterality Date  . ANKLE FRACTURE SURGERY Left   . BACK SURGERY     lumbar-   . CERVICAL DISC SURGERY    . COLONOSCOPY W/ POLYPECTOMY    . kunckle replacement Right   . MOLE REMOVAL Right    behind right ear    Family History  Problem Relation Age of Onset  . Cancer Mother   . Parkinson's disease Father    Social History:  reports that he has never smoked. He quit smokeless tobacco use about 21 years ago.  His smokeless tobacco use included chew. He reports current alcohol use of about 7.0 standard drinks of alcohol per week. He reports that he does not use drugs.  Allergies:  Allergies  Allergen Reactions  . Crestor [Rosuvastatin Calcium]     diarrhea  . Lipitor [Atorvastatin]     Elevated liver enzymes  . Zocor [Simvastatin]     Stomach cramps    Medications Prior to Admission  Medication Sig Dispense Refill  . Cholecalciferol (VITAMIN D3) 50 MCG (2000 UT) TABS Take 2,000 Units by mouth 3 (three) times a week.    . cholestyramine (QUESTRAN) 4 GM/DOSE powder Take 4 g by mouth daily.      . Eluxadoline (VIBERZI) 75 MG TABS Take 75 mg by mouth 2 (two) times daily.     Marland Kitchen ezetimibe (ZETIA) 10 MG tablet Take 10 mg by mouth daily.    Marland Kitchen ibuprofen (ADVIL,MOTRIN) 200 MG tablet Take 400 mg by mouth every 8 (eight) hours as needed (for pain).    Marland Kitchen loperamide (IMODIUM A-D) 2 MG capsule Take 2 mg by mouth as needed for diarrhea or loose stools.    . topiramate (TOPAMAX) 50 MG tablet TAKE 1 TABLET BY MOUTH TWICE A DAY (Patient taking differently: Take 50 mg by mouth 2 (two) times daily. ) 60 tablet 2    No results found for this or any previous visit (from the past 48 hour(s)). No results found.  Review of Systems  Constitutional: Negative.   HENT: Negative.   Eyes: Negative.   Cardiovascular: Negative.   Gastrointestinal: Negative.   Musculoskeletal:       Right hallus MTP pain  Skin: Negative.   Neurological: Negative.   Psychiatric/Behavioral: Negative.     Blood pressure 135/88, pulse 77, temperature (!) 97.5 F (36.4 C), temperature source Oral, resp. rate 20, height 5\' 11"  (1.803 m), weight 97.8 kg, SpO2 97 %. Physical Exam  Constitutional: He appears well-developed.  HENT:  Head: Normocephalic.  Eyes: Pupils are equal, round, and reactive to light.  Neck: Neck supple.  Cardiovascular: Normal rate.  Respiratory: Effort normal.  GI: Soft.  Musculoskeletal:     Comments: Right foot demonstrates bony prominence and pain of the hallux MTP joint.  Significant stiffness.  No pain with hallux IP joint motion.  No other areas of tenderness to palpation.  Motor intact.  Sensation intact dorsal plantar surface of foot.  Foot is warm and well-perfused.  Neurological: He is alert.  Skin: Skin is warm.  Psychiatric: He has a normal mood and affect.     Assessment/Plan We will proceed with right first MTP arthrodesis.  We discussed the risk, benefits alternatives of surgery which include but are not limited to wound healing complications, infection, nonunion, malunion, need for  the surgery, demonstrating structures.  We also discussed the perioperative anesthetic risk which include death.  He understands the postoperative weightbearing restrictions and that he will be heel weightbearing in a postoperative shoe for approximately 6 weeks.  After discussion of these topics he opted to proceed with surgery.  Erle Crocker, MD 02/27/2018, 9:25 AM

## 2018-02-27 NOTE — Discharge Instructions (Signed)
DR. Lucia Gaskins FOOT & ANKLE SURGERY POST-OP INSTRUCTIONS   Pain Management 1. The numbing medicine and your leg will last around 8 hours, take a dose of your pain medicine as soon as you feel it wearing off to avoid rebound pain. 2. Keep your foot elevated above heart level.  Make sure that your heel hangs free ('floats'). 3. Take all prescribed medication as directed. 4. If taking narcotic pain medication you may want to use an over-the-counter stool softener to avoid constipation. 5. You may take over-the-counter NSAIDs (ibuprofen, naproxen, etc.) as well as over-the-counter acetaminophen as directed on the packaging as a supplement for your pain and may also use it to wean away from the prescription medication.  Activity ? Heel weightbearing in post operative shoe ? Keep dressing in place until follow up.  First Postoperative Visit 1. Your first postop visit will be at least 2 weeks after surgery.  This should be scheduled when you schedule surgery. 2. If you do not have a postoperative visit scheduled please call [phone] to schedule an appointment. 3. At the appointment your incision will be evaluated for suture removal, x-rays will be obtained if necessary.  General Instructions 1. Swelling is very common after foot and ankle surgery.  It often takes 3 months for the foot and ankle to begin to feel comfortable.  Some amount of swelling will persist for 6-12 months. 2. DO NOT change the dressing.  If there is a problem with the dressing (too tight, loose, gets wet, etc.) please contact Dr. Pollie Friar office. 3. DO NOT get the dressing wet.  For showers you can use an over-the-counter cast cover or wrap a washcloth around the top of your dressing and then cover it with a plastic bag and tape it to your leg. 4. DO NOT soak the incision (no tubs, pools, bath, etc.) until you have approval from Dr. Lucia Gaskins.  Contact Dr. Huel Cote office or go to Emergency Room if: 1. Temperature above 101  F. 2. Increasing pain that is unresponsive to pain medication or elevation 3. Excessive redness or swelling in your foot 4. Dressing problems - excessive bloody drainage, looseness or tightness, or if dressing gets wet 5. Develop pain, swelling, warmth, or discoloration of your calf     Regional Anesthesia Blocks  1. Numbness or the inability to move the "blocked" extremity may last from 3-48 hours after placement. The length of time depends on the medication injected and your individual response to the medication. If the numbness is not going away after 48 hours, call your surgeon.  2. The extremity that is blocked will need to be protected until the numbness is gone and the  Strength has returned. Because you cannot feel it, you will need to take extra care to avoid injury. Because it may be weak, you may have difficulty moving it or using it. You may not know what position it is in without looking at it while the block is in effect.  3. For blocks in the legs and feet, returning to weight bearing and walking needs to be done carefully. You will need to wait until the numbness is entirely gone and the strength has returned. You should be able to move your leg and foot normally before you try and bear weight or walk. You will need someone to be with you when you first try to ensure you do not fall and possibly risk injury.  4. Bruising and tenderness at the needle site are common side effects  and will resolve in a few days.  5. Persistent numbness or new problems with movement should be communicated to the surgeon or the [facility] [phone] [place] [phone]).       Post Anesthesia Home Care Instructions  Activity: Get plenty of rest for the remainder of the day. A responsible individual must stay with you for 24 hours following the procedure.  For the next 24 hours, DO NOT: -Drive a car -Paediatric nurse -Drink alcoholic beverages -Take  any medication unless instructed by your physician -Make any legal decisions or sign important papers.  Meals: Start with liquid foods such as gelatin or soup. Progress to regular foods as tolerated. Avoid greasy, spicy, heavy foods. If nausea and/or vomiting occur, drink only clear liquids until the nausea and/or vomiting subsides. Call your physician if vomiting continues.  Special Instructions/Symptoms: Your throat may feel dry or sore from the anesthesia or the breathing tube placed in your throat during surgery. If this causes discomfort, gargle with warm salt water. The discomfort should disappear within 24 hours.  If you had a scopolamine patch placed behind your ear for the management of post- operative nausea and/or vomiting:  1. The medication in the patch is effective for 72 hours, after which it should be removed.  Wrap patch in a tissue and discard in the trash. Wash hands thoroughly with soap and water. 2. You may remove the patch earlier than 72 hours if you experience unpleasant side effects which may include dry mouth, dizziness or visual disturbances. 3. Avoid touching the patch. Wash your hands with soap and water after contact with the patch.

## 2018-02-27 NOTE — Op Note (Signed)
Joshua Alvarado male 68 y.o. 02/27/2018  PreOperative Diagnosis: Right hallux rigidus  PostOperative Diagnosis: Same  PROCEDURE: Right first MTP arthrodesis  SURGEON: Radene Journey, MD  ASSISTANT: None  ANESTHESIA: LMA with ankle block  FINDINGS: End-stage right hallux rigidus with osteophytosis  IMPLANTS: Arthrex first MTP arthrodesis plate  [id number]:68 y.o. male had pain in his right hallux MTP joint for several months.  He failed conservative treatment the form of boot immobilization, activity modifications, anti-inflammatories as well as shoe stiffening device.  Given the continued pain he was indicated for operative intervention.  We discussed the risk, benefits alternatives to first MTP arthrodesis which included but were not limited to wound healing complications, infection, nonunion, malunion, need for further surgery, damage to surrounding structures as well as continued pain.  After weighing these risks he opted proceed with surgery.  PROCEDURE: Patient was identified in the preoperative holding area.  The right lower extremity was marked myself.  Consent was signed by myself and the patient.  A ankle block was performed by anesthesia.  He is taken the operative suite placed supine on operative table.  General LMA anesthesia was induced without difficulty.  A bump was placed on the right hip and all bony prominences well-padded.  Preoperative antibiotics were given.  Surgical timeout was performed.  Right lower extremity was prepped and draped in usual sterile fashion.  A 4 inch Esmarch tourniquet was placed.  I began by making a longitudinal incision overlying the first MTP joint.  This was medial to the EHL tendon.  This taken sharply down through skin subcutaneous tissue Bovie cautery was used for hemostasis.  The EHL tendon was identified and the incision was continued sharply down just medial to the EHL tendon through the deep tissue, periosteal tissue and joint  capsule.  Then the joint was mobilized creating capsular flaps medially and laterally.  There is a large amount of mobile osteophytes on the proximal phalanx and distal metatarsal head.  These were rongeured back to a more normal-appearing joint.  Then the joint was fully mobilized and inspected.  There was significant cartilage wear dorsally and plantarly about the first metatarsal head as well as the proximal phalanx.  The guidepin for the cup and cone reamer was placed in the first metatarsal and this was reamed to 20 mm.  Then the guidepin was removed and placed in the proximal phalanx and the cup and cone reamer was used for this as well.  Then the guidepin was used to trephinate both bony surfaces.  Then the joint was reduced and held in place provisionally with a K wire from distal to proximal across the joint.  Then a second K wire was placed after fluoroscopic confirmation of appropriate reduction.  Flat plate was used to ensure the MTP joint was appropriate reduced with elevation which was confirmed.  Then a lag screw was placed across the joint from distal to proximal using the cannulated system.  Then the Arthrex first MTP arthrodesis plate was placed after preparing the dorsal aspect of the metatarsal and phalanx for appropriate plate placement.  A combination of nonlocking and locking screws were used.  Again flat plate was used to confirm appropriate position of the toe.  X-ray was used to confirm appropriate screw length, plate placement and reduction position of the toe.  After this some of the redundant capsular tissue was removed using a 15 blade.  There was free-floating bony fragments within the capsular tissue that was were removed.  Care  was taken to ensure there is no bony prominence medially or dorsally about the hallux.  Then the wound was irrigated with normal saline capsular tissue was closed with 2-0 PDS.  The subcuticular tissue was closed with 3-0 Monocryl and the skin with 3-0 nylon.   Prior to skin closure the tourniquet was released and hemostasis was obtained with Bovie cautery and direct pressure.  He was placed in a soft dressing of Xeroform, 4 x 4 sterile sheet cotton and 4 inch Ace wrap.  He was awakened from anesthesia and taken to recovery in stable condition.  Counts recommended case.  There are no complications.  POST OPERATIVE INSTRUCTIONS: Heel weightbearing right lower extremity Postoperative shoe wear at all times.  Keep dressing dry and elevate the foot.  Follow-up in 2 weeks for wound check, x-rays and suture removal Call the office with concerns. No need for DVT prophylaxis in this ambulatory patient.  TOURNIQUET TIME: Unknown  BLOOD LOSS:  Minimal         DRAINS: none         SPECIMEN: none       COMPLICATIONS:  * No complications entered in OR log *         Disposition: PACU - hemodynamically stable.         Condition: stable

## 2018-02-27 NOTE — Anesthesia Postprocedure Evaluation (Signed)
Anesthesia Post Note  Patient: Joshua Alvarado  Procedure(s) Performed: RIGHT 1ST METATARSOPHALANGEAL ARTHRODESIS (Right )     Patient location during evaluation: PACU Anesthesia Type: General Level of consciousness: awake and alert Pain management: pain level controlled Vital Signs Assessment: post-procedure vital signs reviewed and stable Respiratory status: spontaneous breathing, nonlabored ventilation and respiratory function stable Cardiovascular status: blood pressure returned to baseline and stable Postop Assessment: no apparent nausea or vomiting Anesthetic complications: no    Last Vitals:  Vitals:   02/27/18 1115 02/27/18 1130  BP: 124/80 131/81  Pulse: 70 74  Resp: 10 14  Temp:    SpO2: 99% 100%    Last Pain:  Vitals:   02/27/18 1130  TempSrc:   PainSc: 0-No pain                 Brennan Bailey

## 2018-02-28 ENCOUNTER — Encounter (HOSPITAL_BASED_OUTPATIENT_CLINIC_OR_DEPARTMENT_OTHER): Payer: Self-pay | Admitting: Orthopaedic Surgery

## 2018-03-01 ENCOUNTER — Encounter (HOSPITAL_BASED_OUTPATIENT_CLINIC_OR_DEPARTMENT_OTHER): Payer: Self-pay | Admitting: Orthopaedic Surgery

## 2018-05-09 ENCOUNTER — Other Ambulatory Visit: Payer: Self-pay | Admitting: Neurology

## 2018-07-24 ENCOUNTER — Ambulatory Visit: Payer: Medicare Other | Admitting: Neurology

## 2018-08-06 ENCOUNTER — Other Ambulatory Visit: Payer: Self-pay | Admitting: Neurology

## 2018-09-12 IMAGING — MR MR HEAD WO/W CM
12 series · 48 of 48 positions shown · IV contrast (multihance)
Comparison: None.

CLINICAL DATA: Headaches

Creatinine was obtained on site at [HOSPITAL] at [HOSPITAL].
Results: Creatinine 1.3 mg/dL.
EXAM:
MRI HEAD WITHOUT AND WITH CONTRAST
TECHNIQUE: Multiplanar, multiecho pulse sequences of the brain and surrounding
structures were obtained without and with intravenous contrast.
CONTRAST:  20mL MULTIHANCE GADOBENATE DIMEGLUMINE 529 MG/ML IV SOLN

[Series 2: t1_se_sag · sagittal · 5.0mm · 0.47mm/px · 1 of 21 slices shown]
[im 1/21]
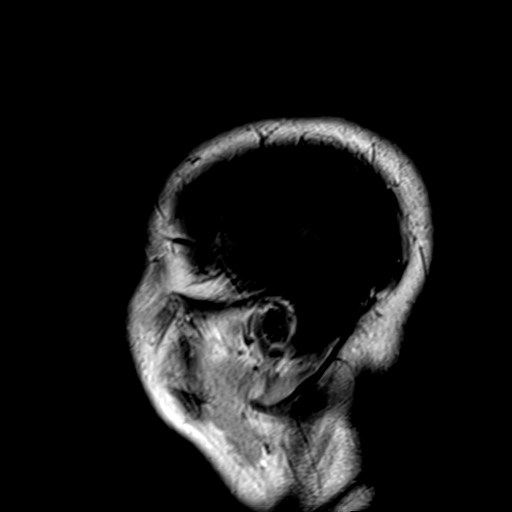

[Series 3: ep2d_diff_(id)_trace · axial · 3.0mm · 1.88mm/px · z∈[-51,+90]mm · 6 of 95 slices shown]
[im 1/95]
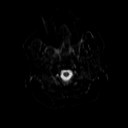
[im 19/95]
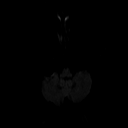
[im 38/95]
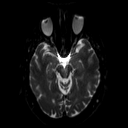
[im 57/95]
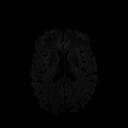
[im 76/95]
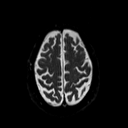
[im 95/95]
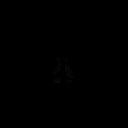

[Series 4: ep2d_diff_(id)_trace_adc · axial · 3.0mm · 1.88mm/px · z∈[-51,+90]mm · 3 of 46 slices shown]
[im 1/46]
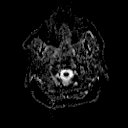
[im 23/46]
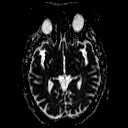
[im 46/46]
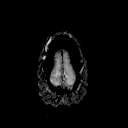

[Series 5: ep2d_diff_cor · coronal · 5.0mm · 1.77mm/px · 3 of 48 slices shown]
[im 1/48]
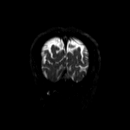
[im 24/48]
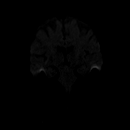
[im 48/48]
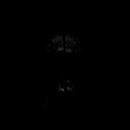

[Series 6: ep2d_diff_cor_adc · coronal · 5.0mm · 1.77mm/px · 2 of 24 slices shown]
[im 1/24]
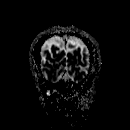
[im 24/24]
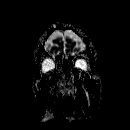

[Series 8: swi_images · axial · 2.0mm · 0.94mm/px · z∈[-57,+100]mm · 5 of 80 slices shown]
[im 1/80]
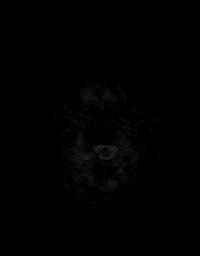
[im 20/80]
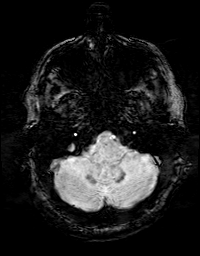
[im 40/80]
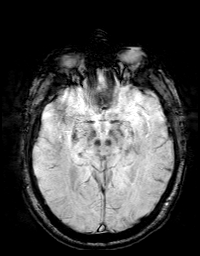
[im 60/80]
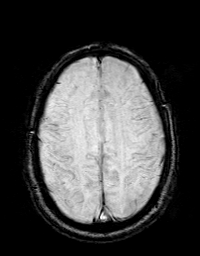
[im 80/80]
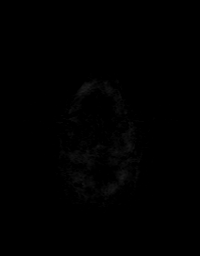

[Series 9: FLAIR · axial · 3.0mm · 0.47mm/px · z∈[-57,+99]mm · 2 of 27 slices shown]
[im 1/27]
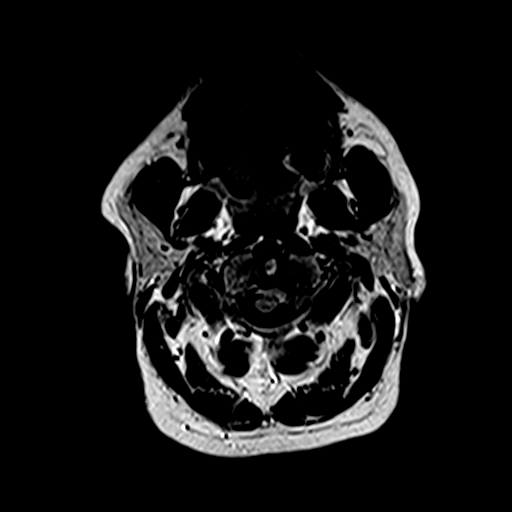
[im 27/27]
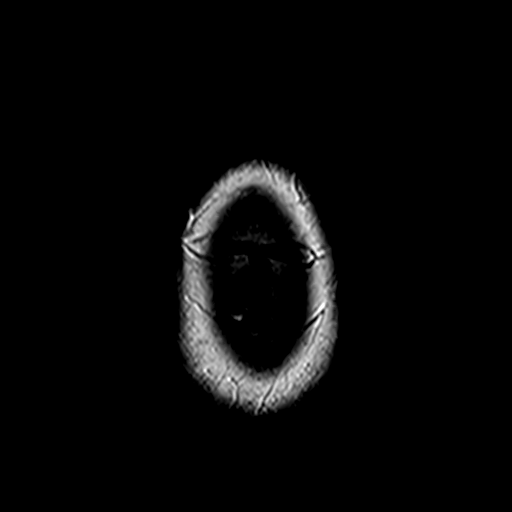

[Series 10: t2_tse_tra_512 · axial · 5.0mm · 0.62mm/px · z∈[-39,+99]mm · 2 of 24 slices shown]
[im 1/24]
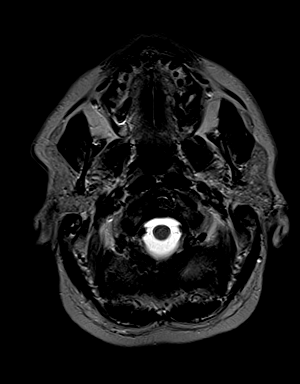
[im 24/24]
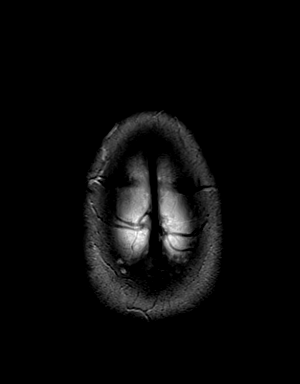

[Series 11: t1_mpr_tra · axial · 1.0mm · 0.75mm/px · z∈[-57,+102]mm · 10 of 160 slices shown]
[im 1/160]
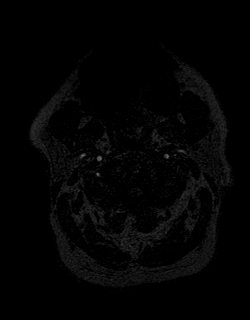
[im 18/160]
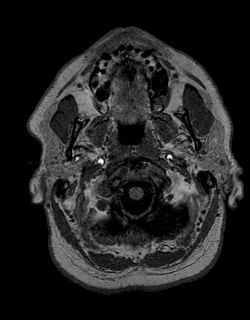
[im 36/160]
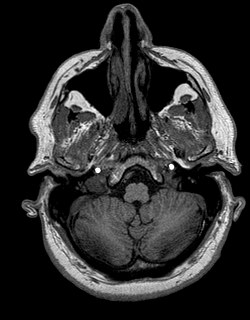
[im 54/160]
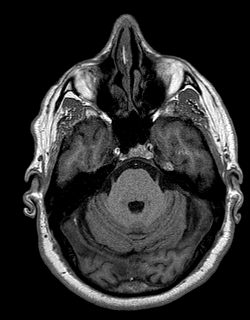
[im 71/160]
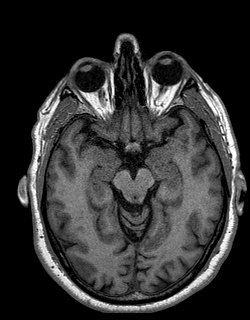
[im 89/160]
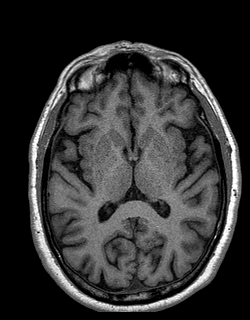
[im 107/160]
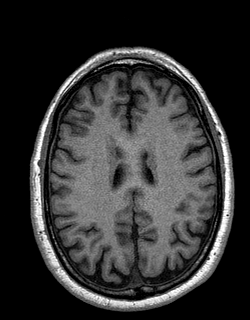
[im 124/160]
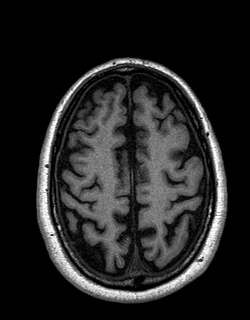
[im 142/160]
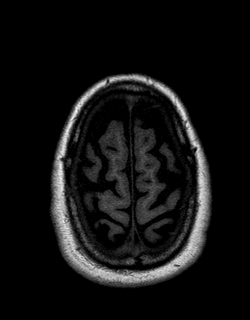
[im 160/160]
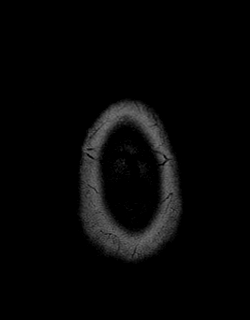

[Series 12: T2 · coronal · 5.0mm · 0.45mm/px · 2 of 26 slices shown]
[im 1/26]
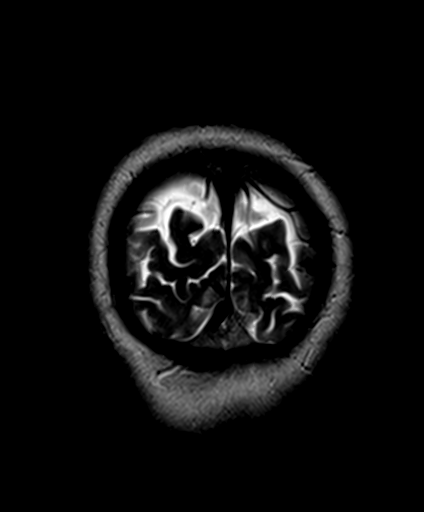
[im 26/26]
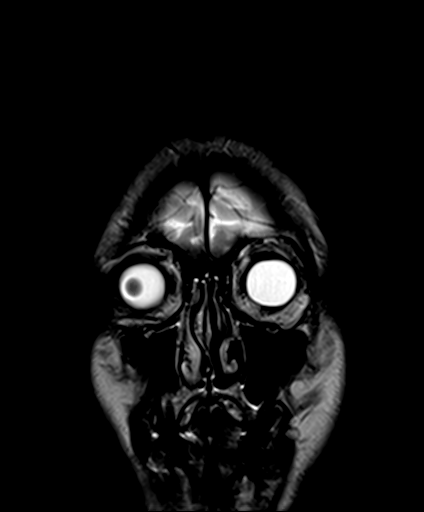

[Series 13: T1 post-contrast · coronal · 5.0mm · 0.72mm/px · 2 of 26 slices shown]
[im 1/26]
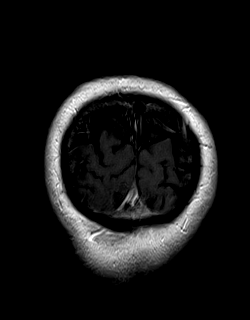
[im 26/26]
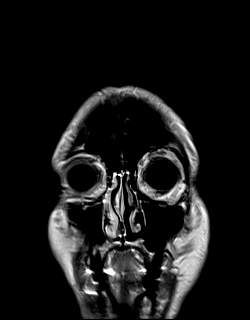

[Series 14: post t1_mpr_tra · axial · 1.0mm · 0.75mm/px · z∈[-57,+102]mm · 10 of 160 slices shown]
[im 1/160]
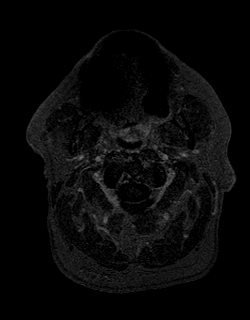
[im 18/160]
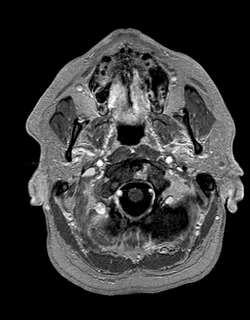
[im 36/160]
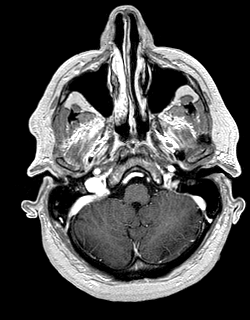
[im 54/160]
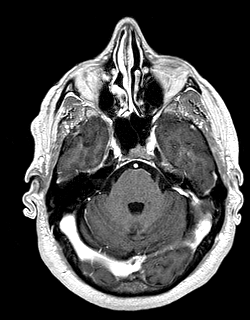
[im 71/160]
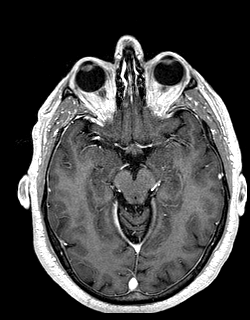
[im 89/160]
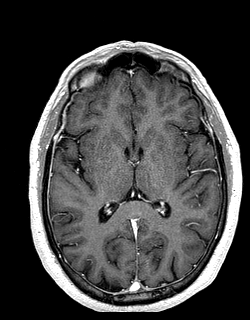
[im 107/160]
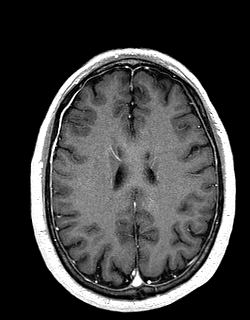
[im 124/160]
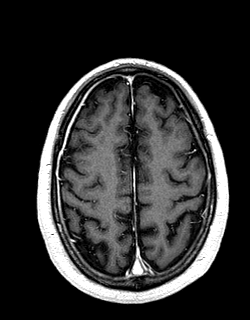
[im 142/160]
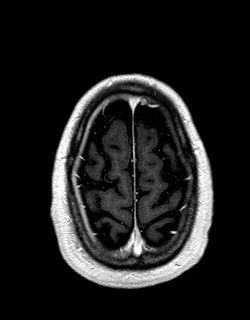
[im 160/160]
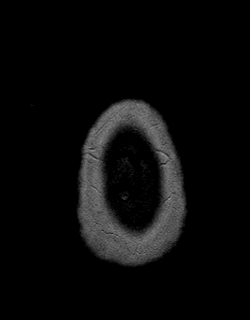

[48 of 48 positions shown; findings below may reference images not displayed]

FINDINGS: Brain: The midline structures are normal. There is no focal
diffusion restriction to indicate acute infarct. There is multifocal
hyperintense T2-weighted signal within the bilateral white matter,
which may be seen in the setting of migraine headaches or early
chronic microvascular disease; however, it is also seen in normal
patients of this age. No intraparenchymal hematoma or chronic
microhemorrhage. Brain volume is normal for age without age-advanced
or lobar predominant atrophy. The dura is normal and there is no
extra-axial collection.

Vascular: Major intracranial arterial and venous sinus flow voids
are preserved.

Skull and upper cervical spine: The visualized skull base,
calvarium, upper cervical spine and extracranial soft tissues are
normal.

Sinuses/Orbits: No fluid levels or advanced mucosal thickening. No
mastoid or middle ear effusion. Normal orbits.
IMPRESSION: Nonspecific scattered foci of white matter hyperintensity. Though
this may be seen in the setting of migraine headaches or
vasculopathy such as early chronic small vessel disease, it is also
seen in normal patients of this age group.

## 2018-09-21 NOTE — Progress Notes (Signed)
Virtual Visit via Video Note The purpose of this virtual visit is to provide medical care while limiting exposure to the novel coronavirus.    Consent was obtained for video visit:  Yes.   Answered questions that patient had about telehealth interaction:  Yes.   I discussed the limitations, risks, security and privacy concerns of performing an evaluation and management service by telemedicine. I also discussed with the patient that there may be a patient responsible charge related to this service. The patient expressed understanding and agreed to proceed.  Pt location: Home Physician Location: Home Name of referring provider:  Lennie Odor, PA-C I connected with [name] at patients initiation/request on 09/24/2018 at 10:10 AM EDT by video enabled telemedicine application and verified that I am speaking with the correct person using two identifiers. Pt MRN:  [id number] Pt DOB:  10-13-1950 Video Participants:  [name]   History of Present Illness:  Joshua Alvarado is a 68 year old male who follows up for primary cough headaches.  UPDATE: Intensity:  Usually 4/10 (sometimes 7/10) Duration:  10 minutes  Frequency:  May have some for 2 to 3 consecutive days and then headache-free for 6 weeks.  Current NSAIDS:  none Current analgesics:  none Current triptans:  none Current ergotamine:  none Current anti-emetic:  none Current muscle relaxants:  none Current anti-anxiolytic:  none Current sleep aide:  none Current Antihypertensive medications:  None Current Antidepressant medications:  none Current Anticonvulsant medications:  topiramate 50mg  twice daily Current anti-CGRP:  none Current Vitamins/Herbal/Supplements:  none Current Antihistamines/Decongestants:  none Other therapy:  none  Caffeine:  Decaf tea Alcohol:  occasional Smoker:  no Diet:  Hydrates with water Exercise:  Not routine Depression:  no; Anxiety:  no Other pain:  no Sleep hygiene:   okay  HISTORY: Headaches started on September 22 or 23, 2017. He denies any preceding event that may have triggered this headache. Initially, it was right periorbital that subsequently involved the entire head, radiating from the back to front. He has a constant dull holocephalic headache. Bending over makes it worse. There is no neck pain. However, he has severe intermittent headaches. They occasionally occur spontaneously, but almost always are triggered by coughing or sneezing. When it occurs, he needs to lay down and it resolves in 1 to 2 hours. He has associated photophobia but no nausea, vomiting, phonophobia or visual disturbance or unilateral numbness or weakness. It occurs approximately 5 to 6 days a month. Headache had actually resolved for 2 days following a lumbar puncture.  Past NSAIDS: Indomethacin trial was ineffective. Past analgesics: acetaminophen, BC/Goody powder, Excedrin Past abortive triptans: no Past muscle relaxants: cyclobenzaprine Past anti-emetic: no Past antihypertensive medications: metoprolol Past antidepressant medications: bupropion, escitalopram Past anticonvulsant medications: zonisamide 200mg  Past vitamins/Herbal/Supplements: no Past antihistamines/decongestants: no Other past therapies: trigger point injections and nerve blocks were ineffective.  Occasional tension type headaches prior to this, but nothing significant. Family history of headache: no  Workup: I MRI of brain with and without contrast from 08/30/16 was personally reviewed and demonstrated scattered nonspecific hyperintense foci in the cerebral white matter. II He underwent lumbar puncture on 11/03/16 to assess for low intracranial pressure. Opening pressure of 13.5 cm H2O and closing pressure of 7 cm H2O. 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  Past Medical History: Past Medical History:  Diagnosis Date  . Cancer (HCC)    squamous right ear  . Depression   . Headache   . History of hiatal hernia    " a long time ago - it does't bother me anymore."  . Hypercholesteremia   . Hypertension    no BP meds now  . Irritable bowel syndrome     Medications: Outpatient Encounter Medications as of 09/24/2018  Medication Sig  . Cholecalciferol (VITAMIN D3) 50 MCG (2000 UT) TABS Take 2,000 Units by mouth 3 (three) times a week.  . cholestyramine (QUESTRAN) 4 GM/DOSE powder Take 4 g by mouth daily.   . Eluxadoline (VIBERZI) 75 MG TABS Take 75 mg by mouth 2 (two) times daily.   Marland Kitchen ezetimibe (ZETIA) 10 MG tablet Take 10 mg by mouth daily.  Marland Kitchen ibuprofen (ADVIL,MOTRIN) 200 MG tablet Take 400 mg by mouth every 8 (eight) hours as needed (for pain).  Marland Kitchen loperamide (IMODIUM A-D) 2 MG capsule Take 2 mg by mouth as needed for diarrhea or loose stools.  . topiramate (TOPAMAX) 50 MG tablet TAKE 1 TABLET BY MOUTH TWICE A DAY   No facility-administered encounter medications on file as of 09/24/2018.     Allergies: Allergies  Allergen Reactions  . Crestor [Rosuvastatin Calcium]     diarrhea  . Lipitor [Atorvastatin]     Elevated liver enzymes  . Zocor [Simvastatin]     Stomach cramps    Family History: Family History  Problem Relation Age of Onset  . Cancer Mother   . Parkinson's disease Father     Social History: Social History   Socioeconomic History  . Marital status: Married    Spouse name: Not on file  . Number of children: 2  . Years of education: Not on file  . Highest education level: 12th grade  Occupational History  .  Occupation: retired    Comment: [employer]  Social Needs  . Financial resource strain: Not on file  . Food insecurity    Worry: Not on file    Inability: Not on file  . Transportation needs    Medical: Not on file    Non-medical: Not on file  Tobacco Use  . Smoking status: Never Smoker  . Smokeless tobacco: Former Systems developer    Types: Chew  Substance and Sexual Activity  . Alcohol use: Yes    Alcohol/week: 7.0 standard drinks    Types: 7 Cans of beer per week    Comment: social  . Drug use: No  . Sexual activity: Not on file  Lifestyle  . Physical activity    Days per week: Not on file    Minutes per session: Not on file  . Stress: Not on file  Relationships  . Social Herbalist on phone: Not on file    Gets together: Not on file    Attends religious service: Not on file    Active member of club or organization: Not on file    Attends meetings of clubs or organizations: Not on file    Relationship status: Not on file  . Intimate partner violence    Fear of current or ex partner: Not on file    Emotionally abused: Not on file  Physically abused: Not on file    Forced sexual activity: Not on file  Other Topics Concern  . Not on file  Social History Narrative   Married, lives with wife in a one story home.     Observations/Objective:   Blood pressure 120/78, height 5\' 11"  (1.803 m), weight 214 lb (97.1 kg). No acute distress.  Alert and oriented.  Speech fluent and not dysarthric.  Language intact.  Eyes orthophoric on primary gaze.  Face symmetric.  Assessment and Plan:   Primary cough headache  1.  For preventative management, topiramate 50 mg twice daily 2.  Limit use of pain relievers to no more than 2 days out of week to prevent risk of rebound or medication-overuse headache. 3.  Keep headache diary 4. Follow up one year   Follow Up Instructions:    -I discussed the assessment and treatment plan with the patient. The patient was provided an  opportunity to ask questions and all were answered. The patient agreed with the plan and demonstrated an understanding of the instructions.   The patient was advised to call back or seek an in-person evaluation if the symptoms worsen or if the condition fails to improve as anticipated.   Dudley Major, DO

## 2018-09-24 ENCOUNTER — Encounter: Payer: Self-pay | Admitting: Neurology

## 2018-09-24 ENCOUNTER — Other Ambulatory Visit: Payer: Self-pay

## 2018-09-24 ENCOUNTER — Telehealth (INDEPENDENT_AMBULATORY_CARE_PROVIDER_SITE_OTHER): Payer: Medicare Other | Admitting: Neurology

## 2018-09-24 VITALS — BP 120/78 | Ht 71.0 in | Wt 214.0 lb

## 2018-09-24 DIAGNOSIS — G4483 Primary cough headache: Secondary | ICD-10-CM

## 2019-01-26 ENCOUNTER — Other Ambulatory Visit: Payer: Self-pay | Admitting: Neurology

## 2019-02-24 ENCOUNTER — Ambulatory Visit: Payer: Medicare Other

## 2019-03-01 ENCOUNTER — Ambulatory Visit: Payer: Medicare Other

## 2019-03-17 ENCOUNTER — Ambulatory Visit: Payer: Medicare Other

## 2019-03-22 ENCOUNTER — Ambulatory Visit: Payer: Medicare Other | Attending: Internal Medicine

## 2019-03-22 DIAGNOSIS — Z23 Encounter for immunization: Secondary | ICD-10-CM | POA: Insufficient documentation

## 2019-03-22 NOTE — Progress Notes (Signed)
   Covid-19 Vaccination Clinic  Name:  Joshua Alvarado    MRN: [id number] DOB: 19-Dec-1950  03/22/2019  Mr. Joshua Alvarado was observed post Covid-19 immunization for 15 minutes without incidence. He was provided with Vaccine Information Sheet and instruction to access the V-Safe system.   Mr. Joshua Alvarado was instructed to call 911 with any severe reactions post vaccine: Marland Kitchen Difficulty breathing  . Swelling of your face and throat  . A fast heartbeat  . A bad rash all over your body  . Dizziness and weakness    Immunizations Administered    Name Date Dose VIS Date Route   Pfizer COVID-19 Vaccine 03/22/2019  1:24 PM 0.3 mL 01/04/2019 Intramuscular   Manufacturer: [place]   Lot: [id number]   [place]: [id number]

## 2019-04-16 ENCOUNTER — Ambulatory Visit: Payer: Medicare Other | Attending: Internal Medicine

## 2019-04-16 DIAGNOSIS — Z23 Encounter for immunization: Secondary | ICD-10-CM

## 2019-04-16 NOTE — Progress Notes (Signed)
   Covid-19 Vaccination Clinic  Name:  Joshua Alvarado    MRN: [id number] DOB: October 11, 1950  04/16/2019  [name] was observed post Covid-19 immunization for 15 minutes without incident. He was provided with Vaccine Information Sheet and instruction to access the V-Safe system.   [name] was instructed to call 911 with any severe reactions post vaccine: Marland Kitchen Difficulty breathing  . Swelling of face and throat  . A fast heartbeat  . A bad rash all over body  . Dizziness and weakness   Immunizations Administered    Name Date Dose VIS Date Route   Pfizer COVID-19 Vaccine 04/16/2019  4:13 PM 0.3 mL 01/04/2019 Intramuscular   Manufacturer: [place]   Lot: [id number]   [place]: [id number]

## 2019-04-26 ENCOUNTER — Other Ambulatory Visit: Payer: Self-pay | Admitting: Neurology

## 2019-07-09 ENCOUNTER — Other Ambulatory Visit: Payer: Self-pay

## 2019-07-09 MED ORDER — TOPIRAMATE 50 MG PO TABS: 50.0000 mg | ORAL_TABLET | Freq: Two times a day (BID) | ORAL | 0 refills | Status: DC

## 2019-07-11 ENCOUNTER — Ambulatory Visit
Admission: RE | Admit: 2019-07-11 | Discharge: 2019-07-11 | Disposition: A | Discharge status: Home / Self Care | Payer: Medicare Other | Source: Ambulatory Visit | Attending: Physician Assistant | Admitting: Physician Assistant

## 2019-07-11 ENCOUNTER — Other Ambulatory Visit: Payer: Self-pay | Admitting: Physician Assistant

## 2019-07-11 DIAGNOSIS — R079 Chest pain, unspecified: Secondary | ICD-10-CM

## 2019-07-17 ENCOUNTER — Other Ambulatory Visit (HOSPITAL_COMMUNITY): Payer: Self-pay | Admitting: Physician Assistant

## 2019-07-17 DIAGNOSIS — R079 Chest pain, unspecified: Secondary | ICD-10-CM

## 2019-07-30 ENCOUNTER — Telehealth (HOSPITAL_COMMUNITY): Payer: Self-pay

## 2019-07-30 NOTE — Telephone Encounter (Signed)
Encounter complete. 

## 2019-08-01 ENCOUNTER — Other Ambulatory Visit: Payer: Self-pay

## 2019-08-01 ENCOUNTER — Ambulatory Visit (HOSPITAL_COMMUNITY)
Admission: RE | Admit: 2019-08-01 | Discharge: 2019-08-01 | Disposition: A | Discharge status: Home / Self Care | Payer: Medicare Other | Source: Ambulatory Visit | Attending: Cardiology | Admitting: Cardiology

## 2019-08-01 DIAGNOSIS — R079 Chest pain, unspecified: Secondary | ICD-10-CM

## 2019-08-01 LAB — MYOCARDIAL PERFUSION IMAGING
LV dias vol: 104 mL (ref 62–150)
LV sys vol: 48 mL
Peak HR: 90 {beats}/min
Rest HR: 57 {beats}/min
SDS: 0
SRS: 0
SSS: 0
TID: 1.07

## 2019-08-01 MED ORDER — REGADENOSON 0.4 MG/5ML IV SOLN
0.4000 mg | Freq: Once | INTRAVENOUS | Status: AC
  Administered 2019-08-01: 0.4 mg via INTRAVENOUS

## 2019-08-01 MED ORDER — TECHNETIUM TC 99M TETROFOSMIN IV KIT
10.1000 | PACK | Freq: Once | INTRAVENOUS | Status: AC | PRN
  Administered 2019-08-01: 10.1 via INTRAVENOUS
  Filled 2019-08-01: qty 11

## 2019-08-01 MED ORDER — TECHNETIUM TC 99M TETROFOSMIN IV KIT
28.0000 | PACK | Freq: Once | INTRAVENOUS | Status: AC | PRN
  Administered 2019-08-01: 28 via INTRAVENOUS
  Filled 2019-08-01: qty 28

## 2019-09-23 NOTE — Progress Notes (Signed)
NEUROLOGY FOLLOW UP OFFICE NOTE  Joshua Alvarado [id number]  HISTORY OF PRESENT ILLNESS: Joshua Alvarado is a 69 year old male who follows up for primary cough headache.  UPDATE: Intensity:  Usually 4/10 (sometimes 7/10) Duration:  10 minutes  Frequency:  May have some for 2 to 3 consecutive days and then headache-free for 6 weeks.  Current NSAIDS:none Current analgesics:none Current triptans:none Current ergotamine:none Current anti-emetic:none Current muscle relaxants:none Current anti-anxiolytic:none Current sleep aide:none Current Antihypertensive medications:None Current Antidepressant medications:none Current Anticonvulsant medications:topiramate 50mg  twice daily Current anti-CGRP:none Current Vitamins/Herbal/Supplements:none Current Antihistamines/Decongestants:none Other therapy:none  Caffeine:Decaf tea Alcohol:occasional Smoker:no Diet:Hydrates with water Exercise:Not routine Depression:no; Anxiety:no Other pain:no Sleep hygiene:okay  HISTORY: Headaches started on September 22 or 23, 2017.He denies any preceding event that may have triggered this headache. Initially, it was right periorbital that subsequently involved the entire head, radiating from the back to front. He has a constant dull holocephalic headache. Bending over makes it worse. There is no neck pain. However, he has severe intermittent headaches. They occasionally occur spontaneously, but almost always are triggered by coughing or sneezing. When it occurs, he needs to lay down and it resolves in 1 to 2 hours.He has associated photophobiabut no nausea, vomiting, phonophobia or visual disturbanceor unilateral numbness or weakness. It occurs approximately 5 to 6 days a month. Headache had actually resolved for 2 days following a lumbar puncture.  Past NSAIDS: Indomethacin trial was ineffective. Past analgesics: acetaminophen,  BC/Goody powder, Excedrin Past abortive triptans: no Past muscle relaxants: cyclobenzaprine Past anti-emetic: no Past antihypertensive medications: metoprolol Past antidepressant medications: bupropion, escitalopram Past anticonvulsant medications: zonisamide 200mg  Past vitamins/Herbal/Supplements: no Past antihistamines/decongestants: no Other past therapies: trigger point injections and nerve blocks were ineffective.  Occasional tension type headaches prior to this, but nothing significant. Family history of headache: no  Workup: I MRI of brain with and without contrast from 08/30/16 was personally reviewed and demonstrated scattered nonspecific hyperintense foci in the cerebral white matter. II He underwent lumbar puncture on 11/03/16 to assess for low intracranial pressure. Opening pressure of 13.5 cm H2O and closing pressure of 7 cm H2O. 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  PAST MEDICAL HISTORY: Past Medical History:  Diagnosis Date  . Cancer (HCC)    squamous right ear  . Depression   . Headache   . History of hiatal hernia    " a long time ago - it does't bother me anymore."  . Hypercholesteremia   . Hypertension    no BP meds now  . Irritable bowel syndrome     MEDICATIONS: Current Outpatient Medications on File Prior to Visit  Medication Sig Dispense Refill  . Cholecalciferol (VITAMIN D3) 50 MCG (2000 UT) TABS Take 2,000 Units by mouth 3 (three) times a week.    . cholestyramine (QUESTRAN) 4 GM/DOSE powder Take 4 g by mouth daily.       . Eluxadoline (VIBERZI) 75 MG TABS Take 75 mg by mouth 2 (two) times daily.     Marland Kitchen ezetimibe (ZETIA) 10 MG tablet Take 10 mg by mouth daily.    Marland Kitchen ibuprofen (ADVIL,MOTRIN) 200 MG tablet  Take 400 mg by mouth every 8 (eight) hours as needed (for pain).    Marland Kitchen loperamide (IMODIUM A-D) 2 MG capsule Take 2 mg by mouth as needed for diarrhea or loose stools.    . topiramate (TOPAMAX) 50 MG tablet Take 1 tablet (50 mg total) by mouth 2 (two) times daily. 180 tablet 0   No current facility-administered medications on file prior to visit.    ALLERGIES: Allergies  Allergen Reactions  . Crestor [Rosuvastatin Calcium]     diarrhea  . Lipitor [Atorvastatin]     Elevated liver enzymes  . Zocor [Simvastatin]     Stomach cramps    FAMILY HISTORY: Family History  Problem Relation Age of Onset  . Cancer Mother   . Parkinson's disease Father     SOCIAL HISTORY: Social History   Socioeconomic History  . Marital status: Married    Spouse name: Not on file  . Number of children: 2  . Years of education: Not on file  . Highest education level: 12th grade  Occupational History  . Occupation: retired    Comment: [employer]  Tobacco Use  . Smoking status: Never Smoker  . Smokeless tobacco: Former Systems developer    Types: Secondary school teacher  . Vaping Use: Never used  Substance and Sexual Activity  . Alcohol use: Yes    Comment: social - a couple of beers a day   . Drug use: No  . Sexual activity: Not on file  Other Topics Concern  . Not on file  Social History Narrative   Married, lives with wife in a one story home.    Social Determinants of Health   Financial Resource Strain:   . Difficulty of Paying Living Expenses: Not on file  Food Insecurity:   . Worried About Charity fundraiser in the Last Year: Not on file  . Ran Out of Food in the Last Year: Not on file  Transportation Needs:   . Lack of Transportation (Medical): Not on file  . Lack of Transportation (Non-Medical): Not on file   Physical Activity:   . Days of Exercise per Week: Not on file  . Minutes of Exercise per Session: Not on file  Stress:   . Feeling of Stress : Not on file  Social Connections:   . Frequency of Communication with Friends and Family: Not on file  . Frequency of Social Gatherings with Friends and Family: Not on file  . Attends Religious Services: Not on file  . Active Member of Clubs or Organizations: Not on file  . Attends Archivist Meetings: Not on file  . Marital Status: Not on file  Intimate Partner Violence:   . Fear of Current or Ex-Partner: Not on file  . Emotionally Abused: Not on file  . Physically Abused: Not on file  . Sexually Abused: Not on file    PHYSICAL EXAM: Blood pressure 136/81, pulse 76, height 5\' 11"  (1.803 m), weight 209 lb 6.4 oz (95 kg), SpO2 96 %. General: No acute distress.  Patient appears well-groomed.   Head:  Normocephalic/atraumatic Eyes:  Fundi examined but not visualized Neck: supple, no paraspinal tenderness, full range of motion Heart:  Regular rate and rhythm Lungs:  Clear to auscultation bilaterally Back: No paraspinal tenderness Neurological Exam: alert and oriented to person, place, and time. Attention span and concentration intact, recent and remote memory intact, fund of knowledge intact.  Speech fluent and not dysarthric, language intact.  CN II-XII intact.  Bulk and tone normal, muscle strength 5/5 throughout.  Sensation to light touch, temperature and vibration intact.  Deep tendon reflexes 2+ throughout, toes downgoing.  Finger to nose and heel to shin testing intact.  Gait normal, Romberg negative.  IMPRESSION: Primary cough headache  PLAN: 1.  Topiramate 50mg  twice daily 2.  Limit use of pain relievers to no more than 2 days out of week to prevent risk of rebound or medication-overuse headache. 3.  Keep headache diary 4.  Follow up in one year  Metta Clines, DO  CC: Lennie Odor, PA

## 2019-09-24 ENCOUNTER — Encounter: Payer: Self-pay | Admitting: Neurology

## 2019-09-24 ENCOUNTER — Ambulatory Visit: Payer: Medicare Other | Admitting: Neurology

## 2019-09-24 ENCOUNTER — Other Ambulatory Visit: Payer: Self-pay

## 2019-09-24 VITALS — BP 136/81 | HR 76 | Ht 71.0 in | Wt 209.4 lb

## 2019-09-24 DIAGNOSIS — G4483 Primary cough headache: Secondary | ICD-10-CM

## 2019-09-24 NOTE — Patient Instructions (Signed)
Continue topiramate. 

## 2019-10-24 ENCOUNTER — Other Ambulatory Visit: Payer: Self-pay | Admitting: Neurology

## 2019-10-28 NOTE — Progress Notes (Signed)
Cardiology Office Note:    Date:  10/30/2019   ID:  Joshua Alvarado, DOB 22-Jan-1951, MRN [id number]  PCP:  Joshua Alvarado, [facility] Cardiologist:  No primary care provider on file.  [facility] Electrophysiologist:  None   Referring MD: Joshua Odor, PA    History of Present Illness:    Joshua Alvarado is a 69 y.o. male with a hx of HTN and HLD who was referred by Joshua Alvarado for evaluation of chest pain.  Patient states that he has been having intermittent chest pain for 51months. Can come on anytime with rest, sleeping, occasionally with activity. Lasts 10-26minutes. Occasionally lasts an hour. Pain goes away without intervention.  Initially started with pressure pain and now more a knot in the center of the chest. Having associated numbness in left arm. Myoview negative with normal EF. Has also noticed more fatigue and daytime sleepiness. Has some increased SOB with activity. No orthopnea, PND, LE edema. No snoring. Able to walk 2 flights of stairs without issues, mows the lawn without needing to stop. Overall, he just feels more fatigued than normal.  The patient also states he has been feeling more lightheaded lately. Blood pressures have been 120/70s at home off BP meds. No syncope. No nausea or vomiting. No palpitations. Has been skipping lunch which he thinks may be making symptoms worse.   Family history: Paternal GF died of MI at age 37. No other heart disease in the family.   Nuclear stress 07/2019 negative for ischemia; EF 54%  Past Medical History:  Diagnosis Date  . Cancer (HCC)    squamous right ear  . Depression   . Headache   . History of hiatal hernia    " a long time ago - it does't bother me anymore."  . Hypercholesteremia   . Hypertension    no BP meds now  . Irritable bowel syndrome     Past Surgical History:  Procedure Laterality Date  . ANKLE FRACTURE SURGERY Left   . ARTHRODESIS METATARSALPHALANGEAL JOINT (MTPJ) Right 02/27/2018     Procedure: RIGHT 1ST METATARSOPHALANGEAL ARTHRODESIS;  Surgeon: Erle Crocker, MD;  Location: [facility];  Service: Orthopedics;  Laterality: Right;  . BACK SURGERY     lumbar-   . CERVICAL DISC SURGERY    . COLONOSCOPY W/ POLYPECTOMY    . kunckle replacement Right   . MOLE REMOVAL Right    behind right ear    Current Medications: Current Meds  Medication Sig  . Cholecalciferol (VITAMIN D3) 50 MCG (2000 UT) TABS Take 2,000 Units by mouth 3 (three) times a week.  . cholestyramine (QUESTRAN) 4 GM/DOSE powder Take 4 g by mouth daily.   . Eluxadoline (VIBERZI) 75 MG TABS Take 75 mg by mouth 2 (two) times daily.   Marland Kitchen ibuprofen (ADVIL,MOTRIN) 200 MG tablet Take 400 mg by mouth every 8 (eight) hours as needed (for pain).  Marland Kitchen loperamide (IMODIUM A-D) 2 MG capsule Take 2 mg by mouth as needed for diarrhea or loose stools.  . NEXLIZET 180-10 MG TABS Take 1 tablet by mouth daily.  Marland Kitchen topiramate (TOPAMAX) 50 MG tablet TAKE 1 TABLET BY MOUTH TWICE A DAY     Allergies:   Crestor [rosuvastatin calcium], Lipitor [atorvastatin], and Zocor [simvastatin]   Social History   Socioeconomic History  . Marital status: Married    Spouse name: Not on file  . Number of children: 2  . Years of education: Not on file  .  Highest education level: 12th grade  Occupational History  . Occupation: retired    Comment: [employer]  Tobacco Use  . Smoking status: Never Smoker  . Smokeless tobacco: Former Systems developer    Types: Secondary school teacher  . Vaping Use: Never used  Substance and Sexual Activity  . Alcohol use: Yes    Comment: social - a couple of beers a day   . Drug use: No  . Sexual activity: Not on file  Other Topics Concern  . Not on file  Social History Narrative   Married, lives with wife in a one story home.    Social Determinants of Health   Financial Resource Strain:   . Difficulty of Paying Living Expenses: Not on file  Food Insecurity:   . Worried About Sales executive in the Last Year: Not on file  . Ran Out of Food in the Last Year: Not on file  Transportation Needs:   . Lack of Transportation (Medical): Not on file  . Lack of Transportation (Non-Medical): Not on file  Physical Activity:   . Days of Exercise per Week: Not on file  . Minutes of Exercise per Session: Not on file  Stress:   . Feeling of Stress : Not on file  Social Connections:   . Frequency of Communication with Friends and Family: Not on file  . Frequency of Social Gatherings with Friends and Family: Not on file  . Attends Religious Services: Not on file  . Active Member of Clubs or Organizations: Not on file  . Attends Archivist Meetings: Not on file  . Marital Status: Not on file     Family History: The patient's family history includes Cancer in his mother; Parkinson's disease in his father.  ROS:   Please see the history of present illness.     All other systems reviewed and are negative.  EKGs/Labs/Other Studies Reviewed:    The following studies were reviewed today: Myoview 07/2019:  Nuclear stress EF: 54%.  The left ventricular ejection fraction is normal (55-65%).  No T wave inversion was noted during stress.  The study is normal.  This is a low risk study.   1. Normal study without ischemia or infarction.  2. Normal LVEF, 54%.  3. This is a low-risk study.    EKG:  EKG is  ordered today.  The ekg ordered today demonstrates NSR with isolated PVC  Recent Labs: A1C 6.6, ALT 29, K 4.7  Recent Lipid Panel TC 249, HDL 42, LDL 181, TG 147  Physical Exam:    VS:  BP (!) 150/80   Pulse 97   Ht 5' 11.5" (1.816 m)   Wt 218 lb (98.9 kg)   SpO2 99%   BMI 29.98 kg/m     Wt Readings from Last 3 Encounters:  10/30/19 218 lb (98.9 kg)  09/24/19 209 lb 6.4 oz (95 kg)  08/01/19 214 lb (97.1 kg)     GEN:  Well nourished, well developed in no acute distress HEENT: Normal NECK: No JVD; No carotid bruits LYMPHATICS: No  lymphadenopathy CARDIAC: RRR, no murmurs, rubs, gallops RESPIRATORY:  Clear to auscultation without rales, wheezing or rhonchi  ABDOMEN: Soft, non-tender, non-distended MUSCULOSKELETAL:  No edema; No deformity  SKIN: Warm and dry NEUROLOGIC:  Alert and oriented x 3 PSYCHIATRIC:  Normal affect   ASSESSMENT:    1. Shortness of breath   2. Fatigue, unspecified type   3. Atypical chest pain  4. Primary hypertension   5. Pure hypercholesterolemia   6. Type 2 diabetes mellitus without complication, without long-term current use of insulin (HCC)    PLAN:    In order of problems listed above:  #DOE: Patient with worsening DOE and daytime fatigue. No chest pain or pressure with exertion (although has some chest pressure/arm numbness intermittently at rest). Has noticed slight decline in exercise capacity. -Myoview negative for ischemia and LVEF 54% -Check TTE  -TSH as below  #Worsening fatigue: Patient reports worsening daytime fatigue. Denies snoring and states he sleeps well. No orthopnea or PND. No LE edema. Starting to "not feel like himself because he is tired." -Check TTE as above -TSH -Sleep study in future pending above work-up  #Atypical chest pain: Patient with chest pressure with arm numbness at rest with associated left arm numbness. Myoview negative for ischemia. -Myoview with LVEF 54% without evidence of ischemia -TTE as above  #HTN: Diet controlled -Check Bps at home x 5days and send in log  #HLD: Intolerant of multiple statins. -On bempedoic acid-ezetimibe  #DMII: Newly diagnosed with A1C 6.6. Discussed importance of following up with PCP for further management. -Will start metformin 500mg  BID with plans to follow-up with PCP for further management   Medication Adjustments/Labs and Tests Ordered: Current medicines are reviewed at length with the patient today.  Concerns regarding medicines are outlined above.  Orders Placed This Encounter  Procedures  .  TSH  . EKG 12-Lead  . ECHOCARDIOGRAM COMPLETE   Meds ordered this encounter  Medications  . metFORMIN (GLUCOPHAGE) 500 MG tablet    Sig: Take 1 tablet (500 mg total) by mouth 2 (two) times daily with a meal.    Dispense:  60 tablet    Refill:  2    Patient Instructions  Medication Instructions:  Your physician has recommended you make the following change in your medication:  -- START Metformin 500 mg - Take 1 tablet (500 mg) by mouth twice daily -- NEW RX SENT Please defer to your Primary Care for management and refills of this medication.   Lab Work: Your physician has recommended that you have lab work today: TSH If you have labs (blood work) drawn today and your tests are completely normal, you will receive your results only by: Marland Kitchen MyChart Message (if you have MyChart) OR . A paper copy in the mail If you have any lab test that is abnormal or we need to change your treatment, we will call you to review the results.  Testing/Procedures: Your physician has requested that you have an echocardiogram. Echocardiography is a painless test that uses sound waves to create images of your heart. It provides your doctor with information about the size and shape of your heart and how well your heart's chambers and valves are working. This procedure takes approximately one hour. There are no restrictions for this procedure.  Follow-Up: At [facility], you and your health needs are our priority.  As part of our continuing mission to provide you with exceptional heart care, we have created designated Provider Care Teams.  These Care Teams include your primary Cardiologist (physician) and Advanced Practice Providers (APPs -  Physician Assistants and Nurse Practitioners) who all work together to provide you with the care you need, when you need it.  We recommend signing up for the patient portal called "MyChart".  Sign up information is provided on this After Visit Summary.  MyChart is used to  connect with patients for Virtual Visits (  Telemedicine).  Patients are able to view lab/test results, encounter notes, upcoming appointments, etc.  Non-urgent messages can be sent to your provider as well.   To learn more about what you can do with MyChart, go to [email or web address].    Your next appointment:   Your physician wants you to follow-up in: 54 MONTHS with Dr. Johney Frame. You will receive a reminder letter in the mail two months in advance. If you don't receive a letter, please call our office to schedule the follow-up appointment.  The format for your next appointment:   In Person with Gwyndolyn Kaufman, MD        Signed, Freada Bergeron, MD  10/30/2019 3:58 PM    [place]

## 2019-10-30 ENCOUNTER — Other Ambulatory Visit: Payer: Self-pay

## 2019-10-30 ENCOUNTER — Ambulatory Visit: Payer: Medicare Other | Admitting: Cardiology

## 2019-10-30 ENCOUNTER — Encounter: Payer: Self-pay | Admitting: Cardiology

## 2019-10-30 VITALS — BP 150/80 | HR 97 | Ht 71.5 in | Wt 218.0 lb

## 2019-10-30 DIAGNOSIS — R0602 Shortness of breath: Secondary | ICD-10-CM

## 2019-10-30 DIAGNOSIS — R0789 Other chest pain: Secondary | ICD-10-CM | POA: Diagnosis not present

## 2019-10-30 DIAGNOSIS — R5383 Other fatigue: Secondary | ICD-10-CM | POA: Diagnosis not present

## 2019-10-30 DIAGNOSIS — I10 Essential (primary) hypertension: Secondary | ICD-10-CM

## 2019-10-30 DIAGNOSIS — E7800 Pure hypercholesterolemia, unspecified: Secondary | ICD-10-CM

## 2019-10-30 DIAGNOSIS — E119 Type 2 diabetes mellitus without complications: Secondary | ICD-10-CM

## 2019-10-30 MED ORDER — METFORMIN HCL 500 MG PO TABS: 500.0000 mg | ORAL_TABLET | Freq: Two times a day (BID) | ORAL | 2 refills | Status: DC

## 2019-10-30 NOTE — Patient Instructions (Addendum)
Medication Instructions:  Your physician has recommended you make the following change in your medication:  -- START Metformin 500 mg - Take 1 tablet (500 mg) by mouth twice daily -- NEW RX SENT Please defer to your Primary Care for management and refills of this medication.   Lab Work: Your physician has recommended that you have lab work today: TSH If you have labs (blood work) drawn today and your tests are completely normal, you will receive your results only by: Marland Kitchen MyChart Message (if you have MyChart) OR . A paper copy in the mail If you have any lab test that is abnormal or we need to change your treatment, we will call you to review the results.  Testing/Procedures: Your physician has requested that you have an echocardiogram. Echocardiography is a painless test that uses sound waves to create images of your heart. It provides your doctor with information about the size and shape of your heart and how well your heart's chambers and valves are working. This procedure takes approximately one hour. There are no restrictions for this procedure.  Follow-Up: At [facility], you and your health needs are our priority.  As part of our continuing mission to provide you with exceptional heart care, we have created designated Provider Care Teams.  These Care Teams include your primary Cardiologist (physician) and Advanced Practice Providers (APPs -  Physician Assistants and Nurse Practitioners) who all work together to provide you with the care you need, when you need it.  We recommend signing up for the patient portal called "MyChart".  Sign up information is provided on this After Visit Summary.  MyChart is used to connect with patients for Virtual Visits (Telemedicine).  Patients are able to view lab/test results, encounter notes, upcoming appointments, etc.  Non-urgent messages can be sent to your provider as well.   To learn more about what you can do with MyChart, go to  [email or web address].    Your next appointment:   Your physician wants you to follow-up in: 15 MONTHS with Dr. Johney Frame. You will receive a reminder letter in the mail two months in advance. If you don't receive a letter, please call our office to schedule the follow-up appointment.  The format for your next appointment:   In Person with Gwyndolyn Kaufman, MD

## 2019-10-31 LAB — TSH: TSH: 2.8 u[IU]/mL (ref 0.450–4.500)

## 2019-11-20 ENCOUNTER — Other Ambulatory Visit: Payer: Self-pay

## 2019-11-20 ENCOUNTER — Ambulatory Visit (HOSPITAL_COMMUNITY): Payer: Medicare Other | Attending: Cardiovascular Disease

## 2019-11-20 DIAGNOSIS — R0602 Shortness of breath: Secondary | ICD-10-CM | POA: Insufficient documentation

## 2019-11-20 LAB — ECHOCARDIOGRAM COMPLETE
Area-P 1/2: 2.77 cm2
P 1/2 time: 706 msec
S' Lateral: 2.6 cm

## 2019-11-20 MED ORDER — PERFLUTREN LIPID MICROSPHERE
1.0000 mL | INTRAVENOUS | Status: AC | PRN
  Administered 2019-11-20: 2 mL via INTRAVENOUS

## 2020-01-20 ENCOUNTER — Other Ambulatory Visit: Payer: Self-pay

## 2020-01-20 MED ORDER — TOPIRAMATE 50 MG PO TABS: 50.0000 mg | ORAL_TABLET | Freq: Two times a day (BID) | ORAL | 1 refills | Status: DC

## 2020-01-20 NOTE — Progress Notes (Signed)
Refill request received from [facility].

## 2020-01-24 ENCOUNTER — Other Ambulatory Visit: Payer: Self-pay | Admitting: Neurology

## 2020-04-30 DIAGNOSIS — K589 Irritable bowel syndrome without diarrhea: Secondary | ICD-10-CM | POA: Diagnosis not present

## 2020-07-30 DIAGNOSIS — N1831 Chronic kidney disease, stage 3a: Secondary | ICD-10-CM | POA: Diagnosis not present

## 2020-07-30 DIAGNOSIS — R7302 Impaired glucose tolerance (oral): Secondary | ICD-10-CM | POA: Diagnosis not present

## 2020-07-30 DIAGNOSIS — E7800 Pure hypercholesterolemia, unspecified: Secondary | ICD-10-CM | POA: Diagnosis not present

## 2020-09-23 NOTE — Progress Notes (Signed)
NEUROLOGY FOLLOW UP OFFICE NOTE  Joshua Alvarado [id number]  Assessment/Plan:   Primary cough headache  Topiramate '50mg'$  twice daily Limit use of pain relievers to no more than 2 days out of week to prevent risk of rebound or medication-overuse headache. Keep headache diary Follow up one year  Subjective:  Joshua Alvarado is a 70 year old male who follows up for primary cough headache.   UPDATE: Intensity:  Usually 3-4/10 Duration:  10 minutes  Frequency:  May have some for 2 to 3 consecutive days and then headache-free for 6 weeks.   Current NSAIDS:  none Current analgesics:  none Current triptans:  none Current ergotamine:  none Current anti-emetic:  none Current muscle relaxants:  none Current anti-anxiolytic:  none Current sleep aide:  none Current Antihypertensive medications:  None Current Antidepressant medications:  none Current Anticonvulsant medications:  topiramate '50mg'$  twice daily Current anti-CGRP:  none Current Vitamins/Herbal/Supplements:  none Current Antihistamines/Decongestants:  none Other therapy:  none   Caffeine:  Decaf tea Alcohol:  occasional Smoker:  no Diet:  Hydrates with water Exercise:  Not routine Depression:  no; Anxiety:  no Other pain:  no Sleep hygiene:  okay   HISTORY: Headaches started on September 22 or 23, 2017.  He denies any preceding event that may have triggered this headache.  Initially, it was right periorbital that subsequently involved the entire head, radiating from the back to front.  He has a constant dull holocephalic headache.  Bending over makes it worse.  There is no neck pain.  However, he has severe intermittent headaches.  They occasionally occur spontaneously, but almost always are triggered by coughing or sneezing.  When it occurs, he needs to lay down and it resolves in 1 to 2 hours. He has associated photophobia but no nausea, vomiting, phonophobia or visual disturbance or unilateral numbness or weakness.   It occurs approximately 5 to 6 days a month.  Headache had actually resolved for 2 days following a lumbar puncture.   Past NSAIDS:  Indomethacin trial was ineffective. Past analgesics:  acetaminophen, BC/Goody powder, Excedrin Past abortive triptans:  no Past muscle relaxants:  cyclobenzaprine Past anti-emetic:  no Past antihypertensive medications: metoprolol Past antidepressant medications:  bupropion, escitalopram Past anticonvulsant medications:  zonisamide '200mg'$  Past vitamins/Herbal/Supplements:  no Past antihistamines/decongestants:  no Other past therapies:  trigger point injections and nerve blocks were ineffective.   Occasional tension type headaches prior to this, but nothing significant. Family history of headache:  no   Workup: I  MRI of brain with and without contrast from 08/30/16 was personally reviewed and demonstrated scattered nonspecific hyperintense foci in the cerebral white matter. II  He underwent lumbar puncture on 11/03/16 to assess for low intracranial pressure.  Opening pressure of 13.5 cm H2O and closing pressure of 7 cm H2O.  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  PAST MEDICAL HISTORY: Past Medical History:  Diagnosis Date   Cancer ([place])    squamous right ear   Depression    Headache    History of hiatal hernia    " a long time ago -  it does't bother me anymore."   Hypercholesteremia    Hypertension    no BP meds now   Irritable bowel syndrome     MEDICATIONS: Current Outpatient Medications on File Prior to Visit  Medication Sig  Dispense Refill   Cholecalciferol (VITAMIN D3) 50 MCG (2000 UT) TABS Take 2,000 Units by mouth 3 (three) times a week.     cholestyramine (QUESTRAN) 4 GM/DOSE powder Take 4 g by mouth daily.      Eluxadoline (VIBERZI) 75 MG TABS Take 75 mg by mouth 2 (two) times daily.      ibuprofen (ADVIL,MOTRIN) 200 MG tablet Take 400 mg by mouth every 8 (eight) hours as needed (for pain).     loperamide (IMODIUM A-D) 2 MG capsule Take 2 mg by mouth as needed for diarrhea or loose stools.     metFORMIN (GLUCOPHAGE) 500 MG tablet Take 1 tablet (500 mg total) by mouth 2 (two) times daily with a meal. 60 tablet 2   NEXLIZET 180-10 MG TABS Take 1 tablet by mouth daily.     topiramate (TOPAMAX) 50 MG tablet TAKE 1 TABLET BY MOUTH TWICE A DAY 180 tablet 1   No current facility-administered medications on file prior to visit.    ALLERGIES: Allergies  Allergen Reactions   Crestor [Rosuvastatin Calcium]     diarrhea   Lipitor [Atorvastatin]     Elevated liver enzymes   Zocor [Simvastatin]     Stomach cramps    FAMILY HISTORY: Family History  Problem Relation Age of Onset   Cancer Mother    Parkinson's disease Father       Objective:  Blood pressure (!) 160/83, pulse 92, height '5\' 11"'$  (1.803 m), weight 216 lb 11.2 oz (98.3 kg), SpO2 97 %. General: No acute distress.  Patient appears well-groomed.   Head:  Normocephalic/atraumatic Eyes:  Fundi examined but not visualized Neck: supple, no paraspinal tenderness, full range of motion Heart:  Regular rate and rhythm Lungs:  Clear to auscultation bilaterally Back: No paraspinal tenderness Neurological Exam: alert and oriented to person, place, and time.  Speech fluent and not dysarthric, language intact.  CN II-XII intact. Bulk and tone normal, muscle strength 5/5 throughout.  Sensation to light touch intact.  Deep tendon reflexes 2+ throughout, toes downgoing.  Finger to nose testing intact.  Gait normal, Romberg negative.   Metta Clines, DO  CC:  Lennie Odor, PA

## 2020-09-24 ENCOUNTER — Ambulatory Visit: Payer: Medicare Other | Admitting: Neurology

## 2020-09-24 ENCOUNTER — Other Ambulatory Visit: Payer: Self-pay

## 2020-09-24 ENCOUNTER — Encounter: Payer: Self-pay | Admitting: Neurology

## 2020-09-24 VITALS — BP 160/83 | HR 92 | Ht 71.0 in | Wt 216.7 lb

## 2020-09-24 DIAGNOSIS — G4483 Primary cough headache: Secondary | ICD-10-CM

## 2020-09-24 MED ORDER — TOPIRAMATE 50 MG PO TABS: 50.0000 mg | ORAL_TABLET | Freq: Two times a day (BID) | ORAL | 1 refills | Status: DC

## 2020-09-24 NOTE — Patient Instructions (Signed)
Refilled topiramate

## 2020-10-19 DIAGNOSIS — N1831 Chronic kidney disease, stage 3a: Secondary | ICD-10-CM | POA: Diagnosis not present

## 2020-10-19 DIAGNOSIS — G43909 Migraine, unspecified, not intractable, without status migrainosus: Secondary | ICD-10-CM | POA: Diagnosis not present

## 2020-10-19 DIAGNOSIS — R7302 Impaired glucose tolerance (oral): Secondary | ICD-10-CM | POA: Diagnosis not present

## 2020-10-19 DIAGNOSIS — K589 Irritable bowel syndrome without diarrhea: Secondary | ICD-10-CM | POA: Diagnosis not present

## 2020-10-19 DIAGNOSIS — E7800 Pure hypercholesterolemia, unspecified: Secondary | ICD-10-CM | POA: Diagnosis not present

## 2020-10-19 DIAGNOSIS — G720 Drug-induced myopathy: Secondary | ICD-10-CM | POA: Diagnosis not present

## 2020-10-19 DIAGNOSIS — Z0000 Encounter for general adult medical examination without abnormal findings: Secondary | ICD-10-CM | POA: Diagnosis not present

## 2020-10-26 DIAGNOSIS — D0461 Carcinoma in situ of skin of right upper limb, including shoulder: Secondary | ICD-10-CM | POA: Diagnosis not present

## 2021-01-04 DIAGNOSIS — Z85828 Personal history of other malignant neoplasm of skin: Secondary | ICD-10-CM | POA: Diagnosis not present

## 2021-01-04 DIAGNOSIS — D225 Melanocytic nevi of trunk: Secondary | ICD-10-CM | POA: Diagnosis not present

## 2021-01-04 DIAGNOSIS — D2262 Melanocytic nevi of left upper limb, including shoulder: Secondary | ICD-10-CM | POA: Diagnosis not present

## 2021-01-04 DIAGNOSIS — L821 Other seborrheic keratosis: Secondary | ICD-10-CM | POA: Diagnosis not present

## 2021-01-04 DIAGNOSIS — D2261 Melanocytic nevi of right upper limb, including shoulder: Secondary | ICD-10-CM | POA: Diagnosis not present

## 2021-05-24 ENCOUNTER — Ambulatory Visit: Payer: Self-pay

## 2021-05-24 ENCOUNTER — Ambulatory Visit: Payer: Medicare Other | Admitting: Orthopedic Surgery

## 2021-05-24 ENCOUNTER — Encounter: Payer: Self-pay | Admitting: Orthopedic Surgery

## 2021-05-24 ENCOUNTER — Ambulatory Visit (INDEPENDENT_AMBULATORY_CARE_PROVIDER_SITE_OTHER): Payer: Medicare Other

## 2021-05-24 VITALS — BP 153/84 | HR 71 | Ht 71.0 in | Wt 217.0 lb

## 2021-05-24 DIAGNOSIS — M19041 Primary osteoarthritis, right hand: Secondary | ICD-10-CM

## 2021-05-24 DIAGNOSIS — M25551 Pain in right hip: Secondary | ICD-10-CM

## 2021-05-24 DIAGNOSIS — M79641 Pain in right hand: Secondary | ICD-10-CM

## 2021-05-24 DIAGNOSIS — M79642 Pain in left hand: Secondary | ICD-10-CM | POA: Diagnosis not present

## 2021-05-24 DIAGNOSIS — R2232 Localized swelling, mass and lump, left upper limb: Secondary | ICD-10-CM

## 2021-05-24 MED ORDER — MELOXICAM 7.5 MG PO TABS: 7.5000 mg | ORAL_TABLET | Freq: Every day | ORAL | 0 refills | Status: DC

## 2021-05-24 NOTE — Progress Notes (Signed)
? ?Office Visit Note ?  ?Patient: Joshua Alvarado           ?Date of Birth: [birth year]           ?MRN: [id number] ?Visit Date: 05/24/2021 ?             ?Requested by: Lennie Odor, PA ?[address] ?[address] ?[place],  [place] [address] ?PCP: Lennie Odor, PA ? ? ?Assessment & Plan: ?Visit Diagnoses:  ?1. Pain of right hip   ?2. Pain in right hand   ?3. Pain in left hand   ?4. 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 Mass of hand, left   ? ? ?Plan: Reviewed the x-rays of patient's right hand with patient.  He has joint space narrowing with osteophyte formation involving the right index finger MCP joint.  We discussed treatment for MCP arthritis including conservative treatment with oral or topical anti-inflammatories, therapy, and corticosteroid injection.  He has a history of a right middle finger MCP silicone arthroplasty that is doing well.  He has maintained joint space between the metacarpal and proximal phalanx.  Discussed that the index finger the surgery would likely involve MCP arthrodesis given the stress put on the finger with pinching activities.  At this point, he would like to try conservative treatment with an oral anti-inflammatory medication. ? ?The mass over the dorsum of the middle finger is not bothersome to him at all and the mass in the palmar aspect of the hand is occasionally bothersome when he tightly grips a tool.  We discussed treatment including continued monitoring versus surgical excision.  At this point, he is not ready to discuss excision.  Continue to monitor symptoms and will return to the office if his pain and discomfort worsens. ? ?Follow-Up Instructions: No follow-ups on file.  ? ?Orders:  ?Orders Placed This Encounter  ?Procedures  ? XR Hand Complete Right  ? XR Hand Complete Left  ? ?No orders of the defined types were placed in this encounter. ? ? ? ? Procedures: ?No procedures performed ? ? ?Clinical Data: ?No additional  findings. ? ? ?Subjective: ?Chief Complaint  ?Patient presents with  ? Left Hand - Cyst  ? Right Hand - Pain  ? ? ?This is a 71 year old right-hand-dominant male who presents with complaints involving both hands.  The right hand underwent middle finger MCP silicon arthroplasty some years ago.  He now has diffuse pain across the entire dorsum of his hand on the MP joints for the last 3 or so months..  He has mild swelling.  His pain is worse with prolonged activity.  He is woken a few times with pain at night over the last several weeks.  This seems to be worsening.  He is taking ibuprofen occasionally.  On the left hand he has a well-circumscribed, round, mobile mass over the dorsal aspect of the middle finger that has been there for some time.  It is asymptoamtic. He also has a mass at the palmar aspect of the hand in line with the middle finger at the distal palmar crease.  This mass is occasionally symptomatic when he tries to tightly grip or grasp a tool.  He thinks it has enlarged slightly.  He has no other masses in the palm on either hand. ? ? ?Review of Systems ? ? ?Objective: ?Vital Signs: BP (!) 153/84 (BP Location: Left Arm, Patient Position: Sitting)   Pulse 71   Ht 5'  11" (1.803 m)   Wt 217 lb (98.4 kg)   BMI 30.27 kg/m?  ? ?Physical Exam ?Constitutional:   ?   Appearance: Normal appearance.  ?Cardiovascular:  ?   Rate and Rhythm: Normal rate.  ?   Pulses: Normal pulses.  ?Pulmonary:  ?   Effort: Pulmonary effort is normal.  ?Skin: ?   General: Skin is warm and dry.  ?   Capillary Refill: Capillary refill takes less than 2 seconds.  ?Neurological:  ?   Mental Status: He is alert.  ? ? ?Right Hand Exam  ? ?Tenderness  ?The patient is experiencing no tenderness.  ? ?Range of Motion  ?The patient has normal right wrist ROM.  ? ?Other  ?Erythema: absent ?Sensation: normal ?Pulse: present ? ?Comments:  Able to make a full, complete fist and fully extend fingers.  Mild swelling across MCP joint.   ? ? ?Left Hand Exam  ? ?Comments:  Firm, mobile, well circumscribed mass at dorsal aspect of middle finger that is 1x1 cm.  There is also a firm, 1x1 cm mass in the palm in line with the middle finger at the distal palmar crease.  Adherent to underlying tissue. No associated contracture.  Does not seem to move with flexor tendon.  ? ? ? ? ?Specialty Comments:  ?No specialty comments available. ? ?Imaging: ?No results found. ? ? ?PMFS History: ?Patient Active Problem List  ? Diagnosis Date Noted  ? 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 Mass of hand, left 05/24/2021  ? High blood pressure 02/01/2017  ? ?Past Medical History:  ?Diagnosis Date  ? 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 Depression   ? Headache   ? History of hiatal hernia   ? " a long time ago - it does't bother me anymore."  ? Hypercholesteremia   ? Hypertension   ? no BP meds now  ? Irritable bowel syndrome   ?  ?Family History  ?Problem Relation Age of Onset  ? Cancer Mother   ? Parkinson's disease Father   ?  ?Past Surgical History:  ?Procedure Laterality Date  ? ANKLE FRACTURE SURGERY Left   ? ARTHRODESIS METATARSALPHALANGEAL JOINT (MTPJ) Right 02/27/2018  ? Procedure: RIGHT 1ST METATARSOPHALANGEAL ARTHRODESIS;  Surgeon: Erle Crocker, MD;  Location: [facility];  Service: Orthopedics;  Laterality: Right;  ? BACK SURGERY    ? lumbar-   ? CERVICAL DISC SURGERY    ? COLONOSCOPY W/ POLYPECTOMY    ? kunckle replacement Right   ? MOLE REMOVAL Right   ? behind right ear  ? ?Social History  ? ?Occupational History  ? Occupation: retired  ?  Comment: [employer]  ?Tobacco Use  ? Smoking status: Never  ? Smokeless tobacco: Former  ?  Types: Chew  ?  Quit date: 1999  ?Vaping Use  ? Vaping Use: Never used  ?Substance and Sexual Activity  ? Alcohol use: Yes  ?  Comment: social - a couple of beers a day   ? Drug use: No  ? Sexual activity: Not on file  ? ? ? ? ? ? ?

## 2021-06-19 ENCOUNTER — Other Ambulatory Visit: Payer: Self-pay | Admitting: Orthopedic Surgery

## 2021-07-15 ENCOUNTER — Other Ambulatory Visit: Payer: Self-pay | Admitting: Neurology

## 2021-09-22 NOTE — Progress Notes (Signed)
NEUROLOGY FOLLOW UP OFFICE NOTE  Joshua Alvarado [id number]  Assessment/Plan:   Primary cough headache Tension-type headache, not intractable  Topiramate '50mg'$  twice daily Limit use of pain relievers to no more than 2 days out of week to prevent risk of rebound or medication-overuse headache. Keep headache diary Follow up one year  Subjective:  Joshua Alvarado is a 71 year old male who follows up for primary cough headache.   UPDATE: More like a tension type headache now.  Doesn't really have a cough headache anymore. Intensity:  Usually 3-4/10 Duration:  10 minutes  Frequency:  May have some for 2 to 3 consecutive days and then headache-free for 6 weeks.   Current NSAIDS:  none Current analgesics:  none Current triptans:  none Current ergotamine:  none Current anti-emetic:  none Current muscle relaxants:  none Current anti-anxiolytic:  none Current sleep aide:  none Current Antihypertensive medications:  None Current Antidepressant medications:  none Current Anticonvulsant medications:  topiramate '50mg'$  twice daily Current anti-CGRP:  none Current Vitamins/Herbal/Supplements:  none Current Antihistamines/Decongestants:  none Other therapy:  none   Caffeine:  Decaf tea Alcohol:  occasional Smoker:  no Diet:  Hydrates with water Exercise:  Not routine Depression:  no; Anxiety:  no Other pain:  no Sleep hygiene:  okay   HISTORY: Headaches started on September 22 or 23, 2017.  He denies any preceding event that may have triggered this headache.  Initially, it was right periorbital that subsequently involved the entire head, radiating from the back to front.  He has a constant dull holocephalic headache.  Bending over makes it worse.  There is no neck pain.  However, he has severe intermittent headaches.  They occasionally occur spontaneously, but almost always are triggered by coughing or sneezing.  When it occurs, he needs to lay down and it resolves in 1 to 2  hours. He has associated photophobia but no nausea, vomiting, phonophobia or visual disturbance or unilateral numbness or weakness.  It occurs approximately 5 to 6 days a month.  Headache had actually resolved for 2 days following a lumbar puncture.   Past NSAIDS:  Indomethacin trial was ineffective. Past analgesics:  acetaminophen, BC/Goody powder, Excedrin Past abortive triptans:  no Past muscle relaxants:  cyclobenzaprine Past anti-emetic:  no Past antihypertensive medications: metoprolol Past antidepressant medications:  bupropion, escitalopram Past anticonvulsant medications:  zonisamide '200mg'$  Past vitamins/Herbal/Supplements:  no Past antihistamines/decongestants:  no Other past therapies:  trigger point injections and nerve blocks were ineffective.   Occasional tension type headaches prior to this, but nothing significant. Family history of headache:  no   Workup: I  MRI of brain with and without contrast from 08/30/16 was personally reviewed and demonstrated scattered nonspecific hyperintense foci in the cerebral white matter. II  He underwent lumbar puncture on 11/03/16 to assess for low intracranial pressure.  Opening pressure of 13.5 cm H2O and closing pressure of 7 cm H2O.  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  PAST MEDICAL HISTORY: Past Medical History:  Diagnosis Date   Cancer ([place])    squamous right ear   Depression  Headache    History of hiatal hernia    " a long time ago - it does't bother me anymore."   Hypercholesteremia    Hypertension    no  BP meds now   Irritable bowel syndrome     MEDICATIONS: Current Outpatient Medications on File Prior to Visit  Medication Sig Dispense Refill   Cholecalciferol (VITAMIN D3) 50 MCG (2000 UT) TABS Take 2,000 Units by mouth 3 (three) times a week.     losartan (COZAAR) 25 MG tablet Take 25 mg by mouth daily.     NEXLIZET 180-10 MG TABS Take 1 tablet by mouth daily.     topiramate (TOPAMAX) 50 MG tablet TAKE ONE TABLET BY MOUTH TWICE A DAY 180 tablet 0   buPROPion (WELLBUTRIN XL) 150 MG 24 hr tablet Take 150 mg by mouth every morning.     cholestyramine (QUESTRAN) 4 GM/DOSE powder Take 4 g by mouth daily.  (Patient not taking: Reported on 09/24/2021)     loperamide (IMODIUM) 2 MG capsule Take 2 mg by mouth as needed for diarrhea or loose stools. (Patient not taking: Reported on 09/24/2021)     meloxicam (MOBIC) 7.5 MG tablet TAKE ONE TABLET BY MOUTH DAILY (Patient not taking: Reported on 09/24/2021) 30 tablet 0   metFORMIN (GLUCOPHAGE) 500 MG tablet Take 1 tablet (500 mg total) by mouth 2 (two) times daily with a meal. (Patient not taking: Reported on 09/24/2020) 60 tablet 2   No current facility-administered medications on file prior to visit.     ALLERGIES: Allergies  Allergen Reactions   Crestor [Rosuvastatin Calcium]     diarrhea   Lipitor [Atorvastatin]     Elevated liver enzymes   Zocor [Simvastatin]     Stomach cramps    FAMILY HISTORY: Family History  Problem Relation Age of Onset   Cancer Mother    Parkinson's disease Father       Objective:  Blood pressure (!) 142/80, pulse 72, resp. rate 18, height '5\' 11"'$  (1.803 m), weight 217 lb (98.4 kg), SpO2 95 %. General: No acute distress.  Patient appears well-groomed.   Head:  Normocephalic/atraumatic Eyes:  Fundi examined but not visualized Neck: supple, no paraspinal tenderness, full range of motion Heart:  Regular rate and rhythm Neurological Exam: alert and oriented to person, place, and time.  Speech fluent and not  dysarthric, language intact.  CN II-XII intact. Bulk and tone normal, muscle strength 5/5 throughout.  Sensation to light touch intact.  Deep tendon reflexes 2+ throughout, toes downgoing.  Finger to nose testing with intention tremor bilaterally.  Gait normal, Romberg negative.   Metta Clines, DO  CC: Lennie Odor, PA

## 2021-09-24 ENCOUNTER — Ambulatory Visit: Payer: Medicare Other | Admitting: Neurology

## 2021-09-24 ENCOUNTER — Encounter: Payer: Self-pay | Admitting: Neurology

## 2021-09-24 VITALS — BP 142/80 | HR 72 | Resp 18 | Ht 71.0 in | Wt 217.0 lb

## 2021-09-24 DIAGNOSIS — G44219 Episodic tension-type headache, not intractable: Secondary | ICD-10-CM | POA: Diagnosis not present

## 2021-09-24 DIAGNOSIS — G4483 Primary cough headache: Secondary | ICD-10-CM | POA: Diagnosis not present

## 2021-10-11 ENCOUNTER — Other Ambulatory Visit: Payer: Self-pay | Admitting: Neurology

## 2022-01-06 ENCOUNTER — Other Ambulatory Visit: Payer: Self-pay | Admitting: Neurology

## 2022-07-03 ENCOUNTER — Other Ambulatory Visit: Payer: Self-pay | Admitting: Neurology

## 2022-09-22 NOTE — Progress Notes (Signed)
NEUROLOGY FOLLOW UP OFFICE NOTE  [name] [id number]  Assessment/Plan:   Primary cough headache Tension-type headache, not intractable  Topiramate 50mg  twice daily  Limit use of pain relievers to no more than 2 days out of week to prevent risk of rebound or medication-overuse headache. Keep headache diary Follow up one year   Subjective:  Joshua Alvarado is a 72 year old male who follows up for primary cough headache.   UPDATE: Continues to feel well Intensity:  Usually 3-4/10 Duration:  10 minutes  Frequency:  May have some for 2 to 3 consecutive days and then headache-free for 2 months.     Current NSAIDS:  none Current analgesics:  none Current triptans:  none Current ergotamine:  none Current anti-emetic:  none Current muscle relaxants:  none Current anti-anxiolytic:  none Current sleep aide:  none Current Antihypertensive medications:  losartan Current Antidepressant medications:  Wellbutrin Current Anticonvulsant medications:  topiramate 50mg  twice daily Current anti-CGRP:  none Current Vitamins/Herbal/Supplements:  none Current Antihistamines/Decongestants:  none Other therapy:  none   Caffeine:  Decaf tea Alcohol:  occasional Smoker:  no Diet:  Hydrates with water Exercise:  Not routine Depression:  no; Anxiety:  no Other pain:  no Sleep hygiene:  okay   HISTORY: Headaches started on September 22 or 23, 2017.  He denies any preceding event that may have triggered this headache.  Initially, it was right periorbital that subsequently involved the entire head, radiating from the back to front.  He has a constant dull holocephalic headache.  Bending over makes it worse.  There is no neck pain.  However, he has severe intermittent headaches.  They occasionally occur spontaneously, but almost always are triggered by coughing or sneezing.  When it occurs, he needs to lay down and it resolves in 1 to 2 hours. He has associated photophobia but no nausea,  vomiting, phonophobia or visual disturbance or unilateral numbness or weakness.  It occurs approximately 5 to 6 days a month.  Headache had actually resolved for 2 days following a lumbar puncture.   Past NSAIDS:  Indomethacin trial was ineffective. Past analgesics:  acetaminophen, BC/Goody powder, Excedrin Past abortive triptans:  no Past muscle relaxants:  cyclobenzaprine Past anti-emetic:  no Past antihypertensive medications: metoprolol Past antidepressant medications:  bupropion, escitalopram Past anticonvulsant medications:  zonisamide 200mg  Past vitamins/Herbal/Supplements:  no Past antihistamines/decongestants:  no Other past therapies:  trigger point injections and nerve blocks were ineffective.   Occasional tension type headaches prior to this, but nothing significant. Family history of headache:  no   Workup: I  MRI of brain with and without contrast from 08/30/16 was personally reviewed and demonstrated scattered nonspecific hyperintense foci in the cerebral white matter. II  He underwent lumbar puncture on 11/03/16 to assess for low intracranial pressure.  Opening pressure of 13.5 cm H2O and closing pressure of 7 cm H2O.  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  PAST MEDICAL HISTORY: Past Medical History:  Diagnosis Date   Cancer [facility])    squamous right ear   Depression    Headache    History  of hiatal hernia    " a long time ago - it does't bother me anymore."   Hypercholesteremia    Hypertension    no BP meds now   Irritable bowel syndrome      MEDICATIONS: Current Outpatient Medications on File Prior to Visit  Medication Sig Dispense Refill   buPROPion (WELLBUTRIN XL) 150 MG 24 hr tablet Take 150 mg by mouth every morning.     Cholecalciferol (VITAMIN D3) 50 MCG (2000 UT) TABS Take 2,000 Units by mouth 3 (three) times a week.     cholestyramine (QUESTRAN) 4 GM/DOSE powder Take 4 g by mouth daily.  (Patient not taking: Reported on 09/24/2021)     loperamide (IMODIUM) 2 MG capsule Take 2 mg by mouth as needed for diarrhea or loose stools. (Patient not taking: Reported on 09/24/2021)     losartan (COZAAR) 25 MG tablet Take 25 mg by mouth daily.     meloxicam (MOBIC) 7.5 MG tablet TAKE ONE TABLET BY MOUTH DAILY (Patient not taking: Reported on 09/24/2021) 30 tablet 0   metFORMIN (GLUCOPHAGE) 500 MG tablet Take 1 tablet (500 mg total) by mouth 2 (two) times daily with a meal. (Patient not taking: Reported on 09/24/2020) 60 tablet 2   NEXLIZET 180-10 MG TABS Take 1 tablet by mouth daily.     topiramate (TOPAMAX) 50 MG tablet TAKE 1 TABLET BY MOUTH TWICE A DAY 180 tablet 0   No current facility-administered medications on file prior to visit.     ALLERGIES: Allergies  Allergen Reactions   Crestor [Rosuvastatin Calcium]     diarrhea   Lipitor [Atorvastatin]     Elevated liver enzymes   Zocor [Simvastatin]     Stomach cramps    FAMILY HISTORY: Family History  Problem Relation Age of Onset   Cancer Mother    Parkinson's disease Father       Objective:  Blood pressure (!) 148/80, pulse 69, height 6' (1.829 m), weight 208 lb (94.3 kg), SpO2 100%. General: No acute distress.  Patient appears well-groomed.   Head:  Normocephalic/atraumatic Eyes:  Fundi examined but not visualized Neck: supple, no paraspinal tenderness, full range of motion Heart:  Regular rate and rhythm Neurological Exam: alert and oriented.  Speech fluent and not dysarthric, language intact.  CN II-XII intact. Bulk and tone normal, muscle strength 5/5  throughout.  Sensation to light touch intact.  Deep tendon reflexes 2+ throughout.  Finger to nose testing intact.  Gait normal, Romberg negative.   Shon Millet, DO  CC: Milus Height, PA

## 2022-09-27 ENCOUNTER — Ambulatory Visit: Payer: Medicare Other | Admitting: Neurology

## 2022-09-27 ENCOUNTER — Encounter: Payer: Self-pay | Admitting: Neurology

## 2022-09-27 VITALS — BP 148/80 | HR 69 | Ht 72.0 in | Wt 208.0 lb

## 2022-09-27 DIAGNOSIS — G44219 Episodic tension-type headache, not intractable: Secondary | ICD-10-CM | POA: Diagnosis not present

## 2022-09-27 DIAGNOSIS — G4483 Primary cough headache: Secondary | ICD-10-CM | POA: Diagnosis not present

## 2022-09-27 MED ORDER — TOPIRAMATE 50 MG PO TABS: 50.0000 mg | ORAL_TABLET | Freq: Two times a day (BID) | ORAL | 3 refills | Status: DC

## 2022-09-27 NOTE — Patient Instructions (Signed)
Topiramate

## 2022-12-21 ENCOUNTER — Other Ambulatory Visit: Payer: Self-pay | Admitting: Orthopedic Surgery

## 2022-12-21 DIAGNOSIS — M5416 Radiculopathy, lumbar region: Secondary | ICD-10-CM

## 2022-12-30 ENCOUNTER — Encounter: Payer: Self-pay | Admitting: Orthopedic Surgery

## 2023-01-02 NOTE — Discharge Instructions (Signed)

## 2023-01-03 ENCOUNTER — Inpatient Hospital Stay
Admission: RE | Admit: 2023-01-03 | Discharge: 2023-01-03 | Disposition: A | Discharge status: Home / Self Care | Payer: Medicare Other | Source: Ambulatory Visit | Attending: Orthopedic Surgery | Admitting: Orthopedic Surgery

## 2023-01-03 ENCOUNTER — Other Ambulatory Visit: Payer: Self-pay | Admitting: Orthopedic Surgery

## 2023-01-03 DIAGNOSIS — M5416 Radiculopathy, lumbar region: Secondary | ICD-10-CM

## 2023-01-03 MED ORDER — IOPAMIDOL (ISOVUE-M 200) INJECTION 41%
1.0000 mL | Freq: Once | INTRAMUSCULAR | Status: AC
  Administered 2023-01-03: 1 mL via EPIDURAL

## 2023-01-03 MED ORDER — METHYLPREDNISOLONE ACETATE 40 MG/ML INJ SUSP (RADIOLOG
80.0000 mg | Freq: Once | INTRAMUSCULAR | Status: AC
  Administered 2023-01-03: 80 mg via EPIDURAL

## 2023-01-27 ENCOUNTER — Other Ambulatory Visit: Payer: Self-pay | Admitting: Orthopedic Surgery

## 2023-02-03 NOTE — Pre-Procedure Instructions (Signed)
 Surgical Instructions   Your procedure is scheduled on Wednesday, January 15th. Report to [facility] Main Entrance A at 05:30 A.M., then check in with the Admitting office. Any questions or running late day of surgery: call [phone]  Questions prior to your surgery date: call [phone], Monday-Friday, 8am-4pm. If you experience any cold or flu symptoms such as cough, fever, chills, shortness of breath, etc. between now and your scheduled surgery, please notify us  at the above number.     Remember:  Do not eat after midnight the night before your surgery  You may drink clear liquids until 04:30 AM the morning of your surgery.   Clear liquids allowed are: Water, Non-Citrus Juices (without pulp), Carbonated Beverages, Clear Tea (no milk, honey, etc.), Black Coffee Only (NO MILK, CREAM OR POWDERED CREAMER of any kind), and Gatorade.  Patient Instructions  The night before surgery:  No food after midnight. ONLY clear liquids after midnight  The day of surgery (if you do NOT have diabetes):  Drink ONE (1) Pre-Surgery Clear Ensure by 04:30 AM the morning of surgery. Drink in one sitting. Do not sip.  This drink was given to you during your hospital  pre-op appointment visit.  Nothing else to drink after completing the  Pre-Surgery Clear Ensure.          If you have questions, please contact your surgeon's office.    Take these medicines the morning of surgery with A SIP OF WATER  buPROPion  (WELLBUTRIN  XL)  Eluxadoline  (VIBERZI )  NEXLIZET  topiramate  (TOPAMAX )     One week prior to surgery, STOP taking any Aspirin (unless otherwise instructed by your surgeon) Aleve, Naproxen, Ibuprofen, Motrin, Advil, Goody's, BC's, all herbal medications, fish oil, and non-prescription vitamins.                     Do NOT Smoke (Tobacco/Vaping) for 24 hours prior to your procedure.  If you use a CPAP at night, you may bring your mask/headgear for your overnight stay.   You will be  asked to remove any contacts, glasses, piercing's, hearing aid's, dentures/partials prior to surgery. Please bring cases for these items if needed.    Patients discharged the day of surgery will not be allowed to drive home, and someone needs to stay with them for 24 hours.  SURGICAL WAITING ROOM VISITATION Patients may have no more than 2 support people in the waiting area - these visitors may rotate.   Pre-op nurse will coordinate an appropriate time for 1 ADULT support person, who may not rotate, to accompany patient in pre-op.  Children under the age of 65 must have an adult with them who is not the patient and must remain in the main waiting area with an adult.  If the patient needs to stay at the hospital during part of their recovery, the visitor guidelines for inpatient rooms apply.  Please refer to the [facility] website for the visitor guidelines for any additional information.   If you received a COVID test during your pre-op visit  it is requested that you wear a mask when out in public, stay away from anyone that may not be feeling well and notify your surgeon if you develop symptoms. If you have been in contact with anyone that has tested positive in the last 10 days please notify you surgeon.      Pre-operative 5 CHG Bathing Instructions   You can play a key role in reducing the risk of infection  after surgery. Your skin needs to be as free of germs as possible. You can reduce the number of germs on your skin by washing with CHG (chlorhexidine  gluconate) soap before surgery. CHG is an antiseptic soap that kills germs and continues to kill germs even after washing.   DO NOT use if you have an allergy to chlorhexidine /CHG or antibacterial soaps. If your skin becomes reddened or irritated, stop using the CHG and notify one of our RNs at [phone].   Please shower with the CHG soap starting 4 days before surgery using the following schedule:     Please keep in mind the  following:  DO NOT shave, including legs and underarms, starting the day of your first shower.   You may shave your face at any point before/day of surgery.  Place clean sheets on your bed the day you start using CHG soap. Use a clean washcloth (not used since being washed) for each shower. DO NOT sleep with pets once you start using the CHG.   CHG Shower Instructions:  Wash your face and private area with normal soap. If you choose to wash your hair, wash first with your normal shampoo.  After you use shampoo/soap, rinse your hair and body thoroughly to remove shampoo/soap residue.  Turn the water OFF and apply about 3 tablespoons (45 ml) of CHG soap to a CLEAN washcloth.  Apply CHG soap ONLY FROM YOUR NECK DOWN TO YOUR TOES (washing for 3-5 minutes)  DO NOT use CHG soap on face, private areas, open wounds, or sores.  Pay special attention to the area where your surgery is being performed.  If you are having back surgery, having someone wash your back for you may be helpful. Wait 2 minutes after CHG soap is applied, then you may rinse off the CHG soap.  Pat dry with a clean towel  Put on clean clothes/pajamas   If you choose to wear lotion, please use ONLY the CHG-compatible lotions that are listed below.  Additional instructions for the day of surgery: DO NOT APPLY any lotions, deodorants, cologne, or perfumes.   Do not bring valuables to the hospital. [facility] is not responsible for any belongings/valuables. Do not wear nail polish, gel polish, artificial nails, or any other type of covering on natural nails (fingers and toes) Do not wear jewelry or makeup Put on clean/comfortable clothes.  Please brush your teeth.  Ask your nurse before applying any prescription medications to the skin.     CHG Compatible Lotions   Aveeno Moisturizing lotion  Cetaphil Moisturizing Cream  Cetaphil Moisturizing Lotion  Clairol Herbal Essence Moisturizing Lotion, Dry Skin  Clairol Herbal  Essence Moisturizing Lotion, Extra Dry Skin  Clairol Herbal Essence Moisturizing Lotion, Normal Skin  Curel Age Defying Therapeutic Moisturizing Lotion with Alpha Hydroxy  Curel Extreme Care Body Lotion  Curel Soothing Hands Moisturizing Hand Lotion  Curel Therapeutic Moisturizing Cream, Fragrance-Free  Curel Therapeutic Moisturizing Lotion, Fragrance-Free  Curel Therapeutic Moisturizing Lotion, Original Formula  Eucerin Daily Replenishing Lotion  Eucerin Dry Skin Therapy Plus Alpha Hydroxy Crme  Eucerin Dry Skin Therapy Plus Alpha Hydroxy Lotion  Eucerin Original Crme  Eucerin Original Lotion  Eucerin Plus Crme Eucerin Plus Lotion  Eucerin TriLipid Replenishing Lotion  Keri Anti-Bacterial Hand Lotion  Keri Deep Conditioning Original Lotion Dry Skin Formula Softly Scented  Keri Deep Conditioning Original Lotion, Fragrance Free Sensitive Skin Formula  Keri Lotion Fast Absorbing Fragrance Free Sensitive Skin Formula  Keri Lotion Fast Absorbing Softly Scented  Dry Skin Formula  Keri Original Lotion  Keri Skin Renewal Lotion Keri Silky Smooth Lotion  Keri Silky Smooth Sensitive Skin Lotion  Nivea Body Creamy Conditioning Oil  Nivea Body Extra Enriched Lotion  Nivea Body Original Lotion  Nivea Body Sheer Moisturizing Lotion Nivea Crme  Nivea Skin Firming Lotion  NutraDerm 30 Skin Lotion  NutraDerm Skin Lotion  NutraDerm Therapeutic Skin Cream  NutraDerm Therapeutic Skin Lotion  ProShield Protective Hand Cream  Provon moisturizing lotion  Please read over the following fact sheets that you were given.

## 2023-02-06 ENCOUNTER — Other Ambulatory Visit: Payer: Self-pay

## 2023-02-06 ENCOUNTER — Encounter (HOSPITAL_COMMUNITY): Payer: Self-pay

## 2023-02-06 ENCOUNTER — Encounter (HOSPITAL_COMMUNITY)
Admission: RE | Admit: 2023-02-06 | Discharge: 2023-02-06 | Disposition: A | Discharge status: Home / Self Care | Payer: Medicare Other | Source: Ambulatory Visit | Attending: Orthopedic Surgery

## 2023-02-06 VITALS — BP 153/90 | HR 77 | Temp 97.7°F | Resp 18 | Ht 72.0 in | Wt 206.2 lb

## 2023-02-06 DIAGNOSIS — Z01818 Encounter for other preprocedural examination: Secondary | ICD-10-CM | POA: Diagnosis present

## 2023-02-06 HISTORY — DX: Unspecified osteoarthritis, unspecified site: M19.90

## 2023-02-06 HISTORY — DX: Gastro-esophageal reflux disease without esophagitis: K21.9

## 2023-02-06 LAB — TYPE AND SCREEN
ABO/RH(D): O POS
Antibody Screen: NEGATIVE

## 2023-02-06 LAB — BASIC METABOLIC PANEL
Anion gap: 5 (ref 5–15)
BUN: 17 mg/dL (ref 8–23)
CO2: 27 mmol/L (ref 22–32)
Calcium: 9.8 mg/dL (ref 8.9–10.3)
Chloride: 109 mmol/L (ref 98–111)
Creatinine, Ser: 1.29 mg/dL — ABNORMAL HIGH (ref 0.61–1.24)
GFR, Estimated: 59 mL/min — ABNORMAL LOW (ref >60)
Glucose, Bld: 114 mg/dL — ABNORMAL HIGH (ref 70–99)
Potassium: 5.4 mmol/L — ABNORMAL HIGH (ref 3.5–5.1)
Sodium: 141 mmol/L (ref 135–145)

## 2023-02-06 LAB — SURGICAL PCR SCREEN
MRSA, PCR: NEGATIVE
Staphylococcus aureus: NEGATIVE

## 2023-02-06 LAB — CBC
HCT: 49 % (ref 39.0–52.0)
Hemoglobin: 16.3 g/dL (ref 13.0–17.0)
MCH: 31.1 pg (ref 26.0–34.0)
MCHC: 33.3 g/dL (ref 30.0–36.0)
MCV: 93.5 fL (ref 80.0–100.0)
Platelets: 196 10*3/uL (ref 150–400)
RBC: 5.24 MIL/uL (ref 4.22–5.81)
RDW: 12.6 % (ref 11.5–15.5)
WBC: 5.3 10*3/uL (ref 4.0–10.5)
nRBC: 0 % (ref 0.0–0.2)

## 2023-02-06 NOTE — Progress Notes (Signed)
 PCP - Sydelle Close, PA Cardiologist -   PPM/ICD - denies Device Orders - na Rep Notified - na  Chest x-ray - na EKG - PAT 02/06/2023 Stress Test - 08/01/2019 ECHO - 11/20/2019 Cardiac Cath -   Sleep Study - denies CPAP - na  Non-diabetic  Blood Thinner Instructions: denies Aspirin Instructions:denies  ERAS Protcol -Ensure until 0530  Anesthesia review: No  Patient denies shortness of breath, fever, cough and chest pain at PAT appointment   All instructions explained to the patient, with a verbal understanding of the material. Patient agrees to go over the instructions while at home for a better understanding. Patient also instructed to self quarantine after being tested for COVID-19. The opportunity to ask questions was provided.

## 2023-02-07 NOTE — Anesthesia Preprocedure Evaluation (Addendum)
 Anesthesia Evaluation  Patient identified by MRN, date of birth, ID band Patient awake    Reviewed: Allergy & Precautions, NPO status , Patient's Chart, lab work & pertinent test results  History of Anesthesia Complications Negative for: history of anesthetic complications  Airway Mallampati: II  TM Distance: >3 FB Neck ROM: Full    Dental  (+) Dental Advisory Given   Pulmonary neg pulmonary ROS   Pulmonary exam normal breath sounds clear to auscultation       Cardiovascular hypertension (losartan ), Pt. on medications (-) angina (-) Past MI, (-) Cardiac Stents and (-) CABG (-) dysrhythmias + Valvular Problems/Murmurs (mild) AI  Rhythm:Regular Rate:Normal  HLD  TTE 11/20/2019: IMPRESSIONS    1. Left ventricular ejection fraction, by estimation, is 55 to 60%. The  left ventricle has normal function. The left ventricle has no regional  wall motion abnormalities. Left ventricular diastolic parameters were  normal.   2. Right ventricular systolic function is normal. The right ventricular  size is normal.   3. The mitral valve is normal in structure. Trivial mitral valve  regurgitation. No evidence of mitral stenosis.   4. Calcified non coronary cusp . The aortic valve is tricuspid. Aortic  valve regurgitation is mild. Mild to moderate aortic valve  sclerosis/calcification is present, without any evidence of aortic  stenosis.   5. The inferior vena cava is normal in size with greater than 50%  respiratory variability, suggesting right atrial pressure of 3 mmHg.    Stress Test 08/01/2019:  Nuclear stress EF: 54%.  The left ventricular ejection fraction is normal (55-65%).  No T wave inversion was noted during stress.  The study is normal.  This is a low risk study.   1. Normal study without ischemia or infarction.  2. Normal LVEF, 54%.  3. This is a low-risk study.     Neuro/Psych  Headaches, neg Seizures  PSYCHIATRIC DISORDERS  Depression    S/p neck surgery  Neuromuscular disease (lumbar radiculopathy)    GI/Hepatic Neg liver ROS, hiatal hernia,GERD  ,,IBS   Endo/Other  negative endocrine ROS    Renal/GU negative Renal ROS     Musculoskeletal  (+) Arthritis ,    Abdominal   Peds  Hematology negative hematology ROS (+) Lab Results      Component                Value               Date                      WBC                      5.3                 02/06/2023                HGB                      16.3                02/06/2023                HCT                      49.0                02/06/2023  MCV                      93.5                02/06/2023                PLT                      196                 02/06/2023              Anesthesia Other Findings K 5.4 on 02/06/2023, repeat this morning 4.8  Reproductive/Obstetrics                             Anesthesia Physical Anesthesia Plan  ASA: 2  Anesthesia Plan: General   Post-op Pain Management: Tylenol  PO (pre-op)* and Dilaudid  IV   Induction: Intravenous  PONV Risk Score and Plan: 2 and Ondansetron , Dexamethasone  and Treatment may vary due to age or medical condition  Airway Management Planned: Oral ETT  Additional Equipment:   Intra-op Plan:   Post-operative Plan: Extubation in OR  Informed Consent: I have reviewed the patients History and Physical, chart, labs and discussed the procedure including the risks, benefits and alternatives for the proposed anesthesia with the patient or authorized representative who has indicated his/her understanding and acceptance.     Dental advisory given  Plan Discussed with: CRNA and Anesthesiologist  Anesthesia Plan Comments: (Risks of general anesthesia discussed including, but not limited to, sore throat, hoarse voice, chipped/damaged teeth, injury to vocal cords, nausea and vomiting, allergic reactions, lung infection,  heart attack, stroke, and death. All questions answered. )       Anesthesia Quick Evaluation

## 2023-02-08 ENCOUNTER — Ambulatory Visit (HOSPITAL_BASED_OUTPATIENT_CLINIC_OR_DEPARTMENT_OTHER): Payer: Medicare Other | Admitting: Anesthesiology

## 2023-02-08 ENCOUNTER — Ambulatory Visit (HOSPITAL_COMMUNITY): Payer: Medicare Other

## 2023-02-08 ENCOUNTER — Encounter (HOSPITAL_COMMUNITY): Payer: Self-pay | Admitting: Orthopedic Surgery

## 2023-02-08 ENCOUNTER — Ambulatory Visit (HOSPITAL_COMMUNITY): Payer: Medicare Other | Admitting: Anesthesiology

## 2023-02-08 ENCOUNTER — Other Ambulatory Visit: Payer: Self-pay

## 2023-02-08 ENCOUNTER — Encounter (HOSPITAL_COMMUNITY)
Admission: RE | Disposition: A | Discharge status: Home / Self Care | Payer: Self-pay | Source: Home / Self Care | Attending: Orthopedic Surgery

## 2023-02-08 ENCOUNTER — Observation Stay (HOSPITAL_COMMUNITY)
Admission: RE | Admit: 2023-02-08 | Discharge: 2023-02-09 | Disposition: A | Discharge status: Home / Self Care | Payer: Medicare Other | Attending: Orthopedic Surgery | Admitting: Orthopedic Surgery

## 2023-02-08 DIAGNOSIS — I10 Essential (primary) hypertension: Secondary | ICD-10-CM | POA: Diagnosis not present

## 2023-02-08 DIAGNOSIS — M79605 Pain in left leg: Secondary | ICD-10-CM | POA: Diagnosis not present

## 2023-02-08 DIAGNOSIS — M5116 Intervertebral disc disorders with radiculopathy, lumbar region: Principal | ICD-10-CM | POA: Insufficient documentation

## 2023-02-08 DIAGNOSIS — Z87891 Personal history of nicotine dependence: Secondary | ICD-10-CM | POA: Insufficient documentation

## 2023-02-08 DIAGNOSIS — Z79899 Other long term (current) drug therapy: Secondary | ICD-10-CM | POA: Insufficient documentation

## 2023-02-08 DIAGNOSIS — Z85828 Personal history of other malignant neoplasm of skin: Secondary | ICD-10-CM | POA: Diagnosis not present

## 2023-02-08 DIAGNOSIS — M5416 Radiculopathy, lumbar region: Secondary | ICD-10-CM | POA: Diagnosis present

## 2023-02-08 DIAGNOSIS — Z01818 Encounter for other preprocedural examination: Principal | ICD-10-CM

## 2023-02-08 DIAGNOSIS — M1990 Unspecified osteoarthritis, unspecified site: Secondary | ICD-10-CM | POA: Diagnosis not present

## 2023-02-08 HISTORY — PX: LUMBAR LAMINECTOMY/DECOMPRESSION MICRODISCECTOMY: SHX5026

## 2023-02-08 LAB — POCT I-STAT, CHEM 8
BUN: 17 mg/dL (ref 8–23)
Calcium, Ion: 1.25 mmol/L (ref 1.15–1.40)
Chloride: 108 mmol/L (ref 98–111)
Creatinine, Ser: 1.3 mg/dL — ABNORMAL HIGH (ref 0.61–1.24)
Glucose, Bld: 87 mg/dL (ref 70–99)
HCT: 46 % (ref 39.0–52.0)
Hemoglobin: 15.6 g/dL (ref 13.0–17.0)
Potassium: 4.8 mmol/L (ref 3.5–5.1)
Sodium: 143 mmol/L (ref 135–145)
TCO2: 23 mmol/L (ref 22–32)

## 2023-02-08 LAB — ABO/RH: ABO/RH(D): O POS

## 2023-02-08 SURGERY — LUMBAR LAMINECTOMY/DECOMPRESSION MICRODISCECTOMY 1 LEVEL
Anesthesia: General | Site: Spine Lumbar | Laterality: Left

## 2023-02-08 MED ORDER — MORPHINE SULFATE (PF) 2 MG/ML IV SOLN: 1.0000 mg | INTRAVENOUS | Status: DC | PRN

## 2023-02-08 MED ORDER — OXYCODONE HCL 5 MG PO TABS: 5.0000 mg | ORAL_TABLET | Freq: Once | ORAL | Status: DC | PRN

## 2023-02-08 MED ORDER — BUPIVACAINE-EPINEPHRINE (PF) 0.25% -1:200000 IJ SOLN
INTRAMUSCULAR | Status: AC
  Filled 2023-02-08: qty 30

## 2023-02-08 MED ORDER — ONDANSETRON HCL 4 MG/2ML IJ SOLN: 4.0000 mg | Freq: Four times a day (QID) | INTRAMUSCULAR | Status: DC | PRN

## 2023-02-08 MED ORDER — METHOCARBAMOL 500 MG PO TABS: 500.0000 mg | ORAL_TABLET | Freq: Four times a day (QID) | ORAL | Status: DC | PRN

## 2023-02-08 MED ORDER — DOCUSATE SODIUM 100 MG PO CAPS
100.0000 mg | ORAL_CAPSULE | Freq: Two times a day (BID) | ORAL | Status: DC
  Administered 2023-02-08: 100 mg via ORAL
  Filled 2023-02-08: qty 1

## 2023-02-08 MED ORDER — DEXAMETHASONE SODIUM PHOSPHATE 10 MG/ML IJ SOLN
INTRAMUSCULAR | Status: DC | PRN
  Administered 2023-02-08: 10 mg via INTRAVENOUS

## 2023-02-08 MED ORDER — MIDAZOLAM HCL 2 MG/2ML IJ SOLN
INTRAMUSCULAR | Status: AC
  Filled 2023-02-08: qty 2

## 2023-02-08 MED ORDER — SENNOSIDES-DOCUSATE SODIUM 8.6-50 MG PO TABS: 1.0000 | ORAL_TABLET | Freq: Every evening | ORAL | Status: DC | PRN

## 2023-02-08 MED ORDER — BEMPEDOIC ACID-EZETIMIBE 180-10 MG PO TABS: 1.0000 | ORAL_TABLET | Freq: Every day | ORAL | Status: DC

## 2023-02-08 MED ORDER — ELUXADOLINE 75 MG PO TABS: 75.0000 mg | ORAL_TABLET | Freq: Two times a day (BID) | ORAL | Status: DC

## 2023-02-08 MED ORDER — SODIUM CHLORIDE 0.9% FLUSH
3.0000 mL | Freq: Two times a day (BID) | INTRAVENOUS | Status: DC
  Administered 2023-02-08: 10 mL via INTRAVENOUS

## 2023-02-08 MED ORDER — LOSARTAN POTASSIUM 25 MG PO TABS: 12.5000 mg | ORAL_TABLET | Freq: Every day | ORAL | Status: DC

## 2023-02-08 MED ORDER — BUPROPION HCL ER (XL) 150 MG PO TB24: 150.0000 mg | ORAL_TABLET | Freq: Every morning | ORAL | Status: DC

## 2023-02-08 MED ORDER — PROPOFOL 10 MG/ML IV BOLUS
INTRAVENOUS | Status: AC
  Filled 2023-02-08: qty 20

## 2023-02-08 MED ORDER — ACETAMINOPHEN 500 MG PO TABS
1000.0000 mg | ORAL_TABLET | Freq: Once | ORAL | Status: AC
Start: 2023-02-08 — End: 2023-02-08
  Administered 2023-02-08: 1000 mg via ORAL
  Filled 2023-02-08: qty 2

## 2023-02-08 MED ORDER — THROMBIN 20000 UNITS EX SOLR
CUTANEOUS | Status: AC
  Filled 2023-02-08: qty 20000

## 2023-02-08 MED ORDER — AMISULPRIDE (ANTIEMETIC) 5 MG/2ML IV SOLN: 10.0000 mg | Freq: Once | INTRAVENOUS | Status: DC | PRN

## 2023-02-08 MED ORDER — CEFAZOLIN SODIUM-DEXTROSE 2-4 GM/100ML-% IV SOLN
2.0000 g | Freq: Three times a day (TID) | INTRAVENOUS | Status: AC
  Administered 2023-02-08 – 2023-02-09 (×2): 2 g via INTRAVENOUS
  Filled 2023-02-08 (×2): qty 100

## 2023-02-08 MED ORDER — BUPIVACAINE-EPINEPHRINE 0.25% -1:200000 IJ SOLN
INTRAMUSCULAR | Status: DC | PRN
  Administered 2023-02-08: 10 mL
  Administered 2023-02-08: 20 mL

## 2023-02-08 MED ORDER — ACETAMINOPHEN 650 MG RE SUPP: 650.0000 mg | RECTAL | Status: DC | PRN

## 2023-02-08 MED ORDER — HYDROMORPHONE HCL 1 MG/ML IJ SOLN
INTRAMUSCULAR | Status: AC
  Filled 2023-02-08: qty 1

## 2023-02-08 MED ORDER — SODIUM CHLORIDE 0.9% FLUSH
3.0000 mL | Freq: Two times a day (BID) | INTRAVENOUS | Status: DC
  Administered 2023-02-08 (×2): 3 mL via INTRAVENOUS

## 2023-02-08 MED ORDER — TOPIRAMATE 25 MG PO TABS
50.0000 mg | ORAL_TABLET | Freq: Two times a day (BID) | ORAL | Status: DC
  Administered 2023-02-08: 50 mg via ORAL
  Filled 2023-02-08: qty 2

## 2023-02-08 MED ORDER — PHENYLEPHRINE 80 MCG/ML (10ML) SYRINGE FOR IV PUSH (FOR BLOOD PRESSURE SUPPORT)
PREFILLED_SYRINGE | INTRAVENOUS | Status: AC
  Filled 2023-02-08: qty 10

## 2023-02-08 MED ORDER — POVIDONE-IODINE 7.5 % EX SOLN: Freq: Once | CUTANEOUS | Status: DC

## 2023-02-08 MED ORDER — ACETAMINOPHEN 325 MG PO TABS: 650.0000 mg | ORAL_TABLET | ORAL | Status: DC | PRN

## 2023-02-08 MED ORDER — SODIUM CHLORIDE 0.9% FLUSH: 3.0000 mL | INTRAVENOUS | Status: DC | PRN

## 2023-02-08 MED ORDER — CEFAZOLIN SODIUM-DEXTROSE 2-4 GM/100ML-% IV SOLN
2.0000 g | INTRAVENOUS | Status: AC
  Administered 2023-02-08: 2 g via INTRAVENOUS
  Filled 2023-02-08: qty 100

## 2023-02-08 MED ORDER — PHENOL 1.4 % MT LIQD: 1.0000 | OROMUCOSAL | Status: DC | PRN

## 2023-02-08 MED ORDER — ALUM & MAG HYDROXIDE-SIMETH 200-200-20 MG/5ML PO SUSP: 30.0000 mL | Freq: Four times a day (QID) | ORAL | Status: DC | PRN

## 2023-02-08 MED ORDER — FLEET ENEMA RE ENEM: 1.0000 | ENEMA | Freq: Once | RECTAL | Status: DC | PRN

## 2023-02-08 MED ORDER — ALBUMIN HUMAN 5 % IV SOLN: INTRAVENOUS | Status: DC | PRN

## 2023-02-08 MED ORDER — FENTANYL CITRATE (PF) 250 MCG/5ML IJ SOLN
INTRAMUSCULAR | Status: AC
  Filled 2023-02-08: qty 5

## 2023-02-08 MED ORDER — METHOCARBAMOL 1000 MG/10ML IJ SOLN: 500.0000 mg | Freq: Four times a day (QID) | INTRAMUSCULAR | Status: DC | PRN

## 2023-02-08 MED ORDER — EZETIMIBE 10 MG PO TABS
10.0000 mg | ORAL_TABLET | Freq: Every day | ORAL | Status: DC
Start: 2023-02-09 — End: 2023-02-09

## 2023-02-08 MED ORDER — SODIUM CHLORIDE 0.9 % IV SOLN: 250.0000 mL | INTRAVENOUS | Status: DC

## 2023-02-08 MED ORDER — PHENYLEPHRINE 80 MCG/ML (10ML) SYRINGE FOR IV PUSH (FOR BLOOD PRESSURE SUPPORT)
PREFILLED_SYRINGE | INTRAVENOUS | Status: DC | PRN
  Administered 2023-02-08: 160 ug via INTRAVENOUS
  Administered 2023-02-08: 80 ug via INTRAVENOUS

## 2023-02-08 MED ORDER — OXYCODONE-ACETAMINOPHEN 5-325 MG PO TABS
1.0000 | ORAL_TABLET | ORAL | Status: DC | PRN
  Administered 2023-02-08: 2 via ORAL
  Filled 2023-02-08: qty 2

## 2023-02-08 MED ORDER — 0.9 % SODIUM CHLORIDE (POUR BTL) OPTIME
TOPICAL | Status: DC | PRN
  Administered 2023-02-08 (×2): 1000 mL

## 2023-02-08 MED ORDER — DEXAMETHASONE SODIUM PHOSPHATE 10 MG/ML IJ SOLN
INTRAMUSCULAR | Status: AC
  Filled 2023-02-08: qty 1

## 2023-02-08 MED ORDER — MIDAZOLAM HCL 2 MG/2ML IJ SOLN
INTRAMUSCULAR | Status: DC | PRN
  Administered 2023-02-08 (×2): 1 mg via INTRAVENOUS

## 2023-02-08 MED ORDER — LACTATED RINGERS IV SOLN: INTRAVENOUS | Status: DC | PRN

## 2023-02-08 MED ORDER — SUGAMMADEX SODIUM 200 MG/2ML IV SOLN
INTRAVENOUS | Status: DC | PRN
  Administered 2023-02-08: 200 mg via INTRAVENOUS

## 2023-02-08 MED ORDER — FENTANYL CITRATE (PF) 250 MCG/5ML IJ SOLN
INTRAMUSCULAR | Status: DC | PRN
  Administered 2023-02-08: 50 ug via INTRAVENOUS
  Administered 2023-02-08: 100 ug via INTRAVENOUS

## 2023-02-08 MED ORDER — EPHEDRINE SULFATE-NACL 50-0.9 MG/10ML-% IV SOSY
PREFILLED_SYRINGE | INTRAVENOUS | Status: DC | PRN
  Administered 2023-02-08: 5 mg via INTRAVENOUS

## 2023-02-08 MED ORDER — ZOLPIDEM TARTRATE 5 MG PO TABS: 5.0000 mg | ORAL_TABLET | Freq: Every evening | ORAL | Status: DC | PRN

## 2023-02-08 MED ORDER — PHENYLEPHRINE HCL-NACL 20-0.9 MG/250ML-% IV SOLN
INTRAVENOUS | Status: DC | PRN
  Administered 2023-02-08: 25 ug/min via INTRAVENOUS

## 2023-02-08 MED ORDER — PROPOFOL 10 MG/ML IV BOLUS
INTRAVENOUS | Status: DC | PRN
  Administered 2023-02-08: 150 mg via INTRAVENOUS

## 2023-02-08 MED ORDER — ONDANSETRON HCL 4 MG/2ML IJ SOLN
INTRAMUSCULAR | Status: DC | PRN
  Administered 2023-02-08: 4 mg via INTRAVENOUS

## 2023-02-08 MED ORDER — HYDROCODONE-ACETAMINOPHEN 5-325 MG PO TABS
1.0000 | ORAL_TABLET | ORAL | Status: DC | PRN
  Administered 2023-02-08: 1 via ORAL
  Filled 2023-02-08: qty 1

## 2023-02-08 MED ORDER — ROCURONIUM BROMIDE 10 MG/ML (PF) SYRINGE
PREFILLED_SYRINGE | INTRAVENOUS | Status: AC
  Filled 2023-02-08: qty 10

## 2023-02-08 MED ORDER — ORAL CARE MOUTH RINSE: 15.0000 mL | Freq: Once | OROMUCOSAL | Status: AC

## 2023-02-08 MED ORDER — CHLORHEXIDINE GLUCONATE 0.12 % MT SOLN
15.0000 mL | Freq: Once | OROMUCOSAL | Status: AC
Start: 2023-02-08 — End: 2023-02-08
  Administered 2023-02-08: 15 mL via OROMUCOSAL
  Filled 2023-02-08: qty 15

## 2023-02-08 MED ORDER — HYDROMORPHONE HCL 1 MG/ML IJ SOLN
0.2500 mg | INTRAMUSCULAR | Status: DC | PRN
  Administered 2023-02-08: 0.5 mg via INTRAVENOUS

## 2023-02-08 MED ORDER — ONDANSETRON HCL 4 MG/2ML IJ SOLN
INTRAMUSCULAR | Status: AC
  Filled 2023-02-08: qty 2

## 2023-02-08 MED ORDER — ROCURONIUM BROMIDE 10 MG/ML (PF) SYRINGE
PREFILLED_SYRINGE | INTRAVENOUS | Status: DC | PRN
  Administered 2023-02-08 (×2): 20 mg via INTRAVENOUS
  Administered 2023-02-08: 60 mg via INTRAVENOUS
  Administered 2023-02-08: 20 mg via INTRAVENOUS

## 2023-02-08 MED ORDER — BUPIVACAINE LIPOSOME 1.3 % IJ SUSP
INTRAMUSCULAR | Status: DC | PRN
  Administered 2023-02-08: 10 mL

## 2023-02-08 MED ORDER — THROMBIN 20000 UNITS EX SOLR
CUTANEOUS | Status: DC | PRN
  Administered 2023-02-08: 20 mL via TOPICAL

## 2023-02-08 MED ORDER — ONDANSETRON HCL 4 MG PO TABS: 4.0000 mg | ORAL_TABLET | Freq: Four times a day (QID) | ORAL | Status: DC | PRN

## 2023-02-08 MED ORDER — BUPIVACAINE LIPOSOME 1.3 % IJ SUSP
INTRAMUSCULAR | Status: AC
  Filled 2023-02-08: qty 20

## 2023-02-08 MED ORDER — OXYCODONE HCL 5 MG/5ML PO SOLN: 5.0000 mg | Freq: Once | ORAL | Status: DC | PRN

## 2023-02-08 MED ORDER — MENTHOL 3 MG MT LOZG
1.0000 | LOZENGE | OROMUCOSAL | Status: DC | PRN
Start: 2023-02-08 — End: 2023-02-09

## 2023-02-08 MED ORDER — BISACODYL 5 MG PO TBEC: 5.0000 mg | DELAYED_RELEASE_TABLET | Freq: Every day | ORAL | Status: DC | PRN

## 2023-02-08 MED ORDER — LIDOCAINE 2% (20 MG/ML) 5 ML SYRINGE
INTRAMUSCULAR | Status: DC | PRN
  Administered 2023-02-08: 100 mg via INTRAVENOUS

## 2023-02-08 MED ORDER — LIDOCAINE 2% (20 MG/ML) 5 ML SYRINGE
INTRAMUSCULAR | Status: AC
  Filled 2023-02-08: qty 5

## 2023-02-08 SURGICAL SUPPLY — 75 items
BAG COUNTER SPONGE SURGICOUNT (BAG) ×2 IMPLANT
BENZOIN TINCTURE PRP APPL 2/3 (GAUZE/BANDAGES/DRESSINGS) IMPLANT
BNDG GAUZE DERMACEA FLUFF 4 (GAUZE/BANDAGES/DRESSINGS) IMPLANT
BUR ROUND PRECISION 4.0 (BURR) ×2 IMPLANT
CABLE BIPOLOR RESECTION CORD (MISCELLANEOUS) ×2 IMPLANT
CAGE SABLE 10X26 6-12 8D (Cage) IMPLANT
CANISTER SUCT 3000ML PPV (MISCELLANEOUS) ×2 IMPLANT
CNTNR URN SCR LID CUP LEK RST (MISCELLANEOUS) IMPLANT
COVER SURGICAL LIGHT HANDLE (MISCELLANEOUS) ×2 IMPLANT
DRAIN CHANNEL 15F RND FF W/TCR (WOUND CARE) IMPLANT
DRAPE POUCH INSTRU U-SHP 10X18 (DRAPES) ×4 IMPLANT
DRAPE SURG 17X23 STRL (DRAPES) ×8 IMPLANT
DURAPREP 26ML APPLICATOR (WOUND CARE) ×2 IMPLANT
ELECT BLADE 4.0 EZ CLEAN MEGAD (MISCELLANEOUS) ×1
ELECT CAUTERY BLADE 6.4 (BLADE) ×2 IMPLANT
ELECT REM PT RETURN 9FT ADLT (ELECTROSURGICAL) ×1
ELECTRODE BLDE 4.0 EZ CLN MEGD (MISCELLANEOUS) IMPLANT
ELECTRODE REM PT RTRN 9FT ADLT (ELECTROSURGICAL) ×2 IMPLANT
EVACUATOR SILICONE 100CC (DRAIN) IMPLANT
FILTER STRAW FLUID ASPIR (MISCELLANEOUS) ×2 IMPLANT
GAUZE 4X4 16PLY ~~LOC~~+RFID DBL (SPONGE) ×4 IMPLANT
GAUZE SPONGE 4X4 12PLY STRL (GAUZE/BANDAGES/DRESSINGS) ×2 IMPLANT
GLOVE BIO SURGEON STRL SZ 6.5 (GLOVE) ×2 IMPLANT
GLOVE BIO SURGEON STRL SZ8 (GLOVE) ×2 IMPLANT
GLOVE BIOGEL PI IND STRL 7.0 (GLOVE) ×2 IMPLANT
GLOVE BIOGEL PI IND STRL 8 (GLOVE) ×2 IMPLANT
GLOVE SURG ENC MOIS LTX SZ6.5 (GLOVE) ×2 IMPLANT
GOWN STRL REUS W/ TWL LRG LVL3 (GOWN DISPOSABLE) ×2 IMPLANT
GOWN STRL REUS W/ TWL XL LVL3 (GOWN DISPOSABLE) ×4 IMPLANT
IV CATH 14GX2 1/4 (CATHETERS) ×2 IMPLANT
KIT BASIN OR (CUSTOM PROCEDURE TRAY) ×2 IMPLANT
KIT POSITION SURG JACKSON T1 (MISCELLANEOUS) ×2 IMPLANT
KIT TURNOVER KIT B (KITS) ×2 IMPLANT
NDL 18GX1X1/2 (RX/OR ONLY) (NEEDLE) ×2 IMPLANT
NDL 22X1.5 STRL (OR ONLY) (MISCELLANEOUS) ×2 IMPLANT
NDL HYPO 25GX1X1/2 BEV (NEEDLE) ×2 IMPLANT
NDL SPNL 18GX3.5 QUINCKE PK (NEEDLE) ×4 IMPLANT
NEEDLE 18GX1X1/2 (RX/OR ONLY) (NEEDLE) ×1
NEEDLE 22X1.5 STRL (OR ONLY) (MISCELLANEOUS) ×1
NEEDLE HYPO 25GX1X1/2 BEV (NEEDLE) ×1
NEEDLE SPNL 18GX3.5 QUINCKE PK (NEEDLE) ×2
NS IRRIG 1000ML POUR BTL (IV SOLUTION) ×2 IMPLANT
PACK LAMINECTOMY ORTHO (CUSTOM PROCEDURE TRAY) ×2 IMPLANT
PACK UNIVERSAL I (CUSTOM PROCEDURE TRAY) ×2 IMPLANT
PAD ARMBOARD 7.5X6 YLW CONV (MISCELLANEOUS) ×4 IMPLANT
PATTIES SURGICAL .5 X.5 (GAUZE/BANDAGES/DRESSINGS) IMPLANT
PATTIES SURGICAL .5 X1 (DISPOSABLE) ×2 IMPLANT
PUTTY BONE DBX 5CC MIX (Putty) IMPLANT
PUTTY DBX 2.5CC (Putty) ×1 IMPLANT
PUTTY DBX 2.5CC DEPUY (Putty) IMPLANT
ROD PRE BENT EXP 40MM (Rod) IMPLANT
ROD PRE LORDOSED 5.5X45 (Rod) IMPLANT
SCREW SET SINGLE INNER (Screw) IMPLANT
SCREW VIPER CORT FIX 6.00X30 (Screw) IMPLANT
SPONGE INTESTINAL PEANUT (DISPOSABLE) ×2 IMPLANT
SPONGE SURGIFOAM ABS GEL SZ50 (HEMOSTASIS) ×2 IMPLANT
STRIP CLOSURE SKIN 1/2X4 (GAUZE/BANDAGES/DRESSINGS) IMPLANT
SURGIFLO W/THROMBIN 8M KIT (HEMOSTASIS) IMPLANT
SUT MNCRL AB 4-0 PS2 18 (SUTURE) ×2 IMPLANT
SUT VIC AB 0 CT1 18XCR BRD 8 (SUTURE) IMPLANT
SUT VIC AB 1 CT1 18XCR BRD 8 (SUTURE) ×2 IMPLANT
SUT VIC AB 2-0 CT2 18 VCP726D (SUTURE) ×2 IMPLANT
SYR 20ML LL LF (SYRINGE) IMPLANT
SYR BULB IRRIG 60ML STRL (SYRINGE) ×2 IMPLANT
SYR CONTROL 10ML LL (SYRINGE) ×4 IMPLANT
SYR TB 1ML LUER SLIP (SYRINGE) ×8 IMPLANT
TAP EXPEDIUM DL 4.35 (INSTRUMENTS) IMPLANT
TAP EXPEDIUM DL 5.0 (INSTRUMENTS) IMPLANT
TAP EXPEDIUM DL 6.0 (INSTRUMENTS) IMPLANT
TAPE CLOTH SOFT 2X10 (GAUZE/BANDAGES/DRESSINGS) IMPLANT
TOWEL GREEN STERILE (TOWEL DISPOSABLE) ×2 IMPLANT
TOWEL GREEN STERILE FF (TOWEL DISPOSABLE) ×2 IMPLANT
TUBE FUNNEL GL DISP (ORTHOPEDIC DISPOSABLE SUPPLIES) IMPLANT
WATER STERILE IRR 1000ML POUR (IV SOLUTION) ×2 IMPLANT
YANKAUER SUCT BULB TIP NO VENT (SUCTIONS) ×2 IMPLANT

## 2023-02-08 NOTE — Anesthesia Postprocedure Evaluation (Signed)
 Anesthesia Post Note  Patient: Joshua Alvarado  Procedure(s) Performed: LEFT-SIDED LUMBAR TWO - LUMBAR THREE TRANSFORAMINAL LUMBAR INTERBODY FUSION AND DECOMPRESSION WITH INSTRUMENTATION AND ALLOGRAFT (Left: Spine Lumbar)     Patient location during evaluation: PACU Anesthesia Type: General Level of consciousness: awake Pain management: pain level controlled Vital Signs Assessment: post-procedure vital signs reviewed and stable Respiratory status: spontaneous breathing, nonlabored ventilation and respiratory function stable Cardiovascular status: blood pressure returned to baseline and stable Postop Assessment: no apparent nausea or vomiting Anesthetic complications: no   No notable events documented.  Last Vitals:  Vitals:   02/08/23 1345 02/08/23 1548  BP: (!) 141/77 117/77  Pulse: (!) 108 96  Resp: 16 20  Temp:  37.1 C  SpO2: 98% 98%    Last Pain:  Vitals:   02/08/23 1350  TempSrc:   PainSc: 5                  Conard Decent

## 2023-02-08 NOTE — Plan of Care (Signed)

## 2023-02-08 NOTE — Transfer of Care (Signed)
 Immediate Anesthesia Transfer of Care Note  Patient: Joshua Alvarado  Procedure(s) Performed: LEFT-SIDED LUMBAR TWO - LUMBAR THREE TRANSFORAMINAL LUMBAR INTERBODY FUSION AND DECOMPRESSION WITH INSTRUMENTATION AND ALLOGRAFT (Left: Spine Lumbar)  Patient Location: PACU  Anesthesia Type:General  Level of Consciousness: awake, patient cooperative, and responds to stimulation  Airway & Oxygen Therapy: Patient Spontanous Breathing and Patient connected to face mask oxygen  Post-op Assessment: Report given to RN, Post -op Vital signs reviewed and stable, and Patient moving all extremities X 4  Post vital signs: Reviewed and stable  Last Vitals:  Vitals Value Taken Time  BP 114/69 02/08/23 1245  Temp 37.3 C 02/08/23 1245  Pulse 93 02/08/23 1246  Resp 17 02/08/23 1246  SpO2 99 % 02/08/23 1246  Vitals shown include unfiled device data.  Last Pain:  Vitals:   02/08/23 0647  TempSrc:   PainSc: 8          Complications: No notable events documented.

## 2023-02-08 NOTE — Progress Notes (Signed)
 K 4.8 this am. Pt states he took his losartan  this am. BP 141/82. Dr. Leilani Punter is aware

## 2023-02-08 NOTE — H&P (Signed)
 PREOPERATIVE H&P  Chief Complaint: Left leg pain  HPI: Joshua Alvarado is a 73 y.o. male who presents with ongoing pain in the left leg  MRI reveals a large left L2/3 disc herniation extending into the left lateral recess and extraforaminal regions  Patient has failed multiple forms of conservative care and continues to have pain (see office notes for additional details regarding the patient's full course of treatment)  Past Medical History:  Diagnosis Date   Arthritis    Cancer (HCC)    squamous right ear   Depression    GERD (gastroesophageal reflux disease)    Headache    History of hiatal hernia    " a long time ago - it does't bother me anymore."   Hypercholesteremia    Hypertension    no BP meds now   Irritable bowel syndrome    Past Surgical History:  Procedure Laterality Date   ANKLE FRACTURE SURGERY Left    ARTHRODESIS METATARSALPHALANGEAL JOINT (MTPJ) Right 02/27/2018   Procedure: RIGHT 1ST METATARSOPHALANGEAL ARTHRODESIS;  Surgeon: Donnamarie Gables, MD;  Location: [facility];  Service: Orthopedics;  Laterality: Right;   BACK SURGERY     lumbar-    CERVICAL DISC SURGERY     COLONOSCOPY W/ POLYPECTOMY     kunckle replacement Right    MOLE REMOVAL Right    behind right ear   SHOULDER SURGERY Left    Social History   Socioeconomic History   Marital status: Married    Spouse name: Not on file   Number of children: 2   Years of education: Not on file   Highest education level: 12th grade  Occupational History   Occupation: retired    Comment: [employer]  Tobacco Use   Smoking status: Never   Smokeless tobacco: Former    Types: Chew    Quit date: 1999  Vaping Use   Vaping status: Never Used  Substance and Sexual Activity   Alcohol use: Yes    Comment: social - a couple of beers a day    Drug use: No   Sexual activity: Not on file  Other Topics Concern   Not on file  Social History Narrative   Married, lives with wife  in a one story home.    Social Drivers of Corporate investment banker Strain: Not on file  Food Insecurity: Not on file  Transportation Needs: Not on file  Physical Activity: Not on file  Stress: Not on file  Social Connections: Not on file   Family History  Problem Relation Age of Onset   Cancer Mother    Parkinson's disease Father    Allergies  Allergen Reactions   Crestor [Rosuvastatin Calcium] Diarrhea   Lipitor [Atorvastatin]     Elevated liver enzymes   Zocor [Simvastatin]     Stomach cramps   Prior to Admission medications   Medication Sig Start Date End Date Taking? Authorizing Provider  Eluxadoline  (VIBERZI ) 75 MG TABS Take 75 mg by mouth in the morning and at bedtime.   Yes [provider]  losartan  (COZAAR ) 25 MG tablet Take 12.5 mg by mouth daily. 07/29/21  Yes [provider]  NEXLIZET 180-10 MG TABS Take 1 tablet by mouth daily. 07/09/19  Yes [provider]  topiramate  (TOPAMAX ) 50 MG tablet Take 1 tablet (50 mg total) by mouth 2 (two) times daily. 09/27/22  Yes Jaffe, Adam R, DO  buPROPion  (WELLBUTRIN  XL) 150 MG 24  hr tablet Take 150 mg by mouth every morning. 07/13/21   [provider]     All other systems have been reviewed and were otherwise negative with the exception of those mentioned in the HPI and as above.  Physical Exam: Vitals:   02/08/23 0636  BP: (!) 141/82  Pulse: 88  Resp: 16  Temp: 97.8 F (36.6 C)  SpO2: 96%    Body mass index is 27.8 kg/m.  General: Alert, no acute distress Cardiovascular: No pedal edema Respiratory: No cyanosis, no use of accessory musculature Skin: No lesions in the area of chief complaint Neurologic: Sensation intact distally Psychiatric: Patient is competent for consent with normal mood and affect Lymphatic: No axillary or cervical lymphadenopathy  Assessment/Plan: LEFT-SIDED LUMBAR RADICULOPATHY DUE TO LARGE L2/3 DISC HERNIATION Plan for Procedure(s): LEFT-SIDED LUMBAR 2  - LUMBAR 3 DECOMPRESSION, POSSIBLE TRANSFORAMINAL LUMBAR INTERBODY FUSION AND DECOMPRESSION WITH INSTRUMENTATION AND ALLOGRAFT   Joshua Arlington, MD 02/08/2023 8:17 AM

## 2023-02-08 NOTE — Op Note (Addendum)
 PATIENT NAME: Joshua Alvarado   MEDICAL RECORD NO.:   [id number]    DATE OF BIRTH: 1950-08-17   DATE OF PROCEDURE: 02/08/2023                               OPERATIVE REPORT     PREOPERATIVE DIAGNOSES: 1. Left-sided lumbar radiculopathy 2. Large L2/3 disc herniation 3. S/p previous L2/3 decompression   POSTOPERATIVE DIAGNOSES: 1. Left-sided lumbar radiculopathy 2. Large L2/3 disc herniation 3. S/p previous L2/3 decompression   PROCEDURES: 1. Revision L2/3 decompression, with removal of extensive adhesions from the patient's previous decompression 2. Left-sided L2-3 transforaminal lumbar interbody fusion. 3. Right-sided L2-3 posterolateral fusion. 4. Insertion of interbody device x1 (Globus expandable intervertebral spacer). 5. Placement of posterior instrumentation at L2, L3 bilaterally. 6. Use of local autograft. 7. Use of morselized allograft - DBX-mix 8. Intraoperative use of fluoroscopy.   SURGEON:  Oneil Priestly, MD.   ASSISTANTBETHA Ileana Clara, PA-C.   ANESTHESIA:  General endotracheal anesthesia.   COMPLICATIONS:  None.   DISPOSITION:  Stable.   ESTIMATED BLOOD LOSS:   Minimal   INDICATIONS FOR SURGERY:  Briefly, Mr. Ellenwood is a pleasant 73 y.o. -year-old patient who did present to me with severe and ongoing pain in the left leg. I did feel that the symptoms were secondary to the findings noted above.   The patient failed conservative care and did wish to proceed with the procedure  noted above.   OPERATIVE DETAILS:  On 02/08/2023, the patient was brought to surgery and general endotracheal anesthesia was administered.  The patient was placed prone on a well-padded flat Jackson bed with a spinal frame.  Antibiotics were given and a time-out procedure was performed. The back was prepped and draped in the usual fashion.  A midline incision was made overlying the L2-3 intervertebral space.  The fascia was incised at the midline.  The paraspinal musculature  was bluntly swept laterally.  Anatomic landmarks for the pedicles were exposed. Using fluoroscopy, I did cannulate the L2 and L3 pedicles bilaterally, using a medial to lateral cortical trajectory technique.  On the right side, the posterolateral gutter and right facet joint at L2-3 was decorticated and 6 x 30 mm screws were placed and a 40-mm rod was placed and distraction was applied across the rod on the right side.  On the left side, the cannulated pedicle holes were filled with bone wax.  I then proceeded with the decompressive aspect of the procedure.  On the left side, I did perform a thorough and complete neuroforaminal decompression, with near-complete removal of the left L2-3 facet joint. It was readily noticeable that there were significant adhesions between the dura and L3 nerve, and the soft tissue surrounding it.  It was clear that the patient did previously have a decompression at this level.  It was quite a meticulous portion of the procedure removing the adhesions safely.  I did use a micro nerve hook and various curettes, in order to tease away the adhesions, at which point I was able to free up the left L3 nerve, after which point it was gently medially retracted.  This portion of the procedure did take approximately 30 minutes longer than a typical decompression.  A very large left L2-3 disc herniation was readily encountered, and was removed in multiple fragments.  In this way, I was able to thoroughly decompress the left lateral recess at L2-3.  With  an assistant holding medial retraction of the traversing left L3 nerve, I did perform an annulotomy at the posterolateral aspect of the L2-3 intervertebral space.  I then used a series of curettes and pituitary rongeurs to perform a thorough and complete intervertebral diskectomy.  The intervertebral space was then liberally packed with autograft as well as allograft in the form of DBX mix, as was the appropriate-sized  intervertebral spacer.  The spacer was then tamped into position in the usual fashion, and expanded to 10 mm in height.  I was very pleased with the press-fit of the spacer.  I then placed 6 x 30 mm screws on the left at L2 and L3.  A 40-mm rod was then placed and caps were placed. The distraction was then released on the contralateral right side.  All 4 caps were then locked.    At this point, I turned my attention toward the posterolateral fusion to be performed on the right side at L2-3.  As previously documented, the right L2-3 facet joint, and posterolateral gutter, including the right L2 and L3 transverse processes were decorticated.  At this point in the procedure, I did place autograft from the decompression, as well as allograft in the form of DBX mix, along the right L2-3 facet joint and posterolateral gutter, in order to accomplish a posterolateral fusion.   The wound was copiously irrigated with a total of approximately 3 L prior to placing the bone graft.  Additional autograft and allograft were then packed into the posterolateral gutter on the right side to help aid in the L2-3 fusion.  The wound was explored for any undue bleeding and there was no substantial bleeding encountered.  Gel-Foam was placed over the laminectomy site.  The wound was then closed in layers using #1 Vicryl followed by 2-0 Vicryl, followed by 4-0 Monocryl.  Benzoin and Steri-Strips were applied followed by sterile dressing.    Of note, Ileana Clara was my assistant throughout surgery, and did aid in retraction, suctioning, and closure.       Oneil Priestly, MD

## 2023-02-08 NOTE — Anesthesia Procedure Notes (Signed)
 Procedure Name: Intubation Date/Time: 02/08/2023 8:43 AM  Performed by: Dawna Etienne, CRNAPre-anesthesia Checklist: Patient identified, Patient being monitored, Timeout performed, Emergency Drugs available and Suction available Patient Re-evaluated:Patient Re-evaluated prior to induction Oxygen Delivery Method: Circle System Utilized Preoxygenation: Pre-oxygenation with 100% oxygen Induction Type: IV induction Ventilation: Mask ventilation without difficulty Laryngoscope Size: Miller and 2 Grade View: Grade I Tube type: Oral Tube size: 7.5 mm Number of attempts: 1 Airway Equipment and Method: Stylet Placement Confirmation: ETT inserted through vocal cords under direct vision, positive ETCO2 and breath sounds checked- equal and bilateral Secured at: 22 cm Tube secured with: Tape Dental Injury: Teeth and Oropharynx as per pre-operative assessment

## 2023-02-09 DIAGNOSIS — M5116 Intervertebral disc disorders with radiculopathy, lumbar region: Secondary | ICD-10-CM | POA: Diagnosis not present

## 2023-02-09 MED ORDER — OXYCODONE-ACETAMINOPHEN 5-325 MG PO TABS: 1.0000 | ORAL_TABLET | ORAL | 0 refills | Status: DC | PRN

## 2023-02-09 MED ORDER — METHOCARBAMOL 500 MG PO TABS: 500.0000 mg | ORAL_TABLET | Freq: Four times a day (QID) | ORAL | 2 refills | Status: AC | PRN

## 2023-02-09 NOTE — Evaluation (Signed)
Occupational Therapy Evaluation Patient Details Name: Joshua Alvarado MRN: [id number] DOB: 17-May-1950 Today's Date: 02/09/2023   History of Present Illness The pt is a 73 yo male presenting 1/15 for L2-3 decompression and fusion due to chronic back and leg pain. PMH includes: arthritis, cancer, HTN, cervical and lumbar spine surgery.   Clinical Impression   Patient sitting EOB fully clothed, and did well with PT.  Reviewed proper ADL techniques to maximize ADL status while following back precautions.  Patient comfortable with brace management, and no real questions.  Reviewed care transfers with verbalized understanding.  No further OT needs.  Recommend follow up with MD.         If plan is discharge home, recommend the following: Assist for transportation    Functional Status Assessment  Patient has not had a recent decline in their functional status  Equipment Recommendations  None recommended by OT    Recommendations for Other Services       Precautions / Restrictions Precautions Precautions: Back Precaution Booklet Issued: Yes (comment) Precaution Comments: reviewed Required Braces or Orthoses: Spinal Brace Spinal Brace: Thoracolumbosacral orthotic;Applied in sitting position Restrictions Weight Bearing Restrictions Per Provider Order: No      Mobility Bed Mobility                    Transfers Overall transfer level: Modified independent                 General transfer comment: sitting EOB      Balance Overall balance assessment: Mild deficits observed, not formally tested                                         ADL either performed or assessed with clinical judgement   ADL Overall ADL's : At baseline                                             Vision Patient Visual Report: No change from baseline       Perception Perception: Not tested       Praxis Praxis: Not tested       Pertinent  Vitals/Pain Pain Assessment Pain Assessment: Faces Faces Pain Scale: Hurts a little bit Pain Location: Incisional Pain Descriptors / Indicators: Aching Pain Intervention(s): Monitored during session     Extremity/Trunk Assessment Upper Extremity Assessment Upper Extremity Assessment: Overall WFL for tasks assessed   Lower Extremity Assessment Lower Extremity Assessment: Defer to PT evaluation   Cervical / Trunk Assessment Cervical / Trunk Assessment: Back Surgery   Communication Communication Communication: No apparent difficulties   Cognition Arousal: Alert Behavior During Therapy: WFL for tasks assessed/performed Overall Cognitive Status: Within Functional Limits for tasks assessed                                       General Comments   VSS    Exercises     Shoulder Instructions      Home Living Family/patient expects to be discharged to:: Private residence Living Arrangements: Spouse/significant other Available Help at Discharge: Family;Available 24 hours/day Type of Home: House Home Access: Stairs to enter [employer] of Steps: 2  Bathroom Shower/Tub: Automotive engineer: Yes How Accessible: Accessible via walker Home Equipment: Shower seat          Prior Functioning/Environment Prior Level of Function : Independent/Modified Independent;Driving                        OT Problem List: Pain      OT Treatment/Interventions:      OT Goals(Current goals can be found in the care plan section) Acute Rehab OT Goals Patient Stated Goal: Return home OT Goal Formulation: With patient Time For Goal Achievement: 02/13/23 Potential to Achieve Goals: Good  OT Frequency:      Co-evaluation              AM-PAC OT "6 Clicks" Daily Activity     Outcome Measure Help from another person eating meals?: None Help from another person taking care of personal  grooming?: None Help from another person toileting, which includes using toliet, bedpan, or urinal?: None Help from another person bathing (including washing, rinsing, drying)?: None Help from another person to put on and taking off regular upper body clothing?: None Help from another person to put on and taking off regular lower body clothing?: None 6 Click Score: 24   End of Session Equipment Utilized During Treatment: Back brace Nurse Communication: Mobility status  Activity Tolerance: Patient tolerated treatment well Patient left: in chair;with call bell/phone within reach  OT Visit Diagnosis: Unsteadiness on feet (R26.81)                Time: 5643-3295 OT Time Calculation (min): 14 min Charges:  OT General Charges $OT Visit: 1 Visit OT Evaluation $OT Eval Moderate Complexity: 1 Mod  02/09/2023  RP, OTR/L  Acute Rehabilitation Services  Office:  [phone]   Suzanna Obey 02/09/2023, 9:21 AM

## 2023-02-09 NOTE — Evaluation (Signed)
Physical Therapy Brief Evaluation and Discharge Note Patient Details Name: Joshua Alvarado MRN: [id number] DOB: 10-Mar-1950 Today's Date: 02/09/2023   History of Present Illness  The pt is a 73 yo male presenting 1/15 for L2-3 decompression and fusion due to chronic back and leg pain. PMH includes: arthritis, cancer, HTN, cervical and lumbar spine surgery.   Clinical Impression  Pt in bed upon arrival of PT, agreeable to evaluation at this time. Prior to admission the pt was completely independent without need for DME and reports no recent falls. The pt lives with his spouse in a home with 2 steps to enter, has various DME available, and is hopeful to return to independence with mobility and yardwork. The pt was able to complete bed mobility with cues for log roll, sit-stand transfers, hallway ambulation, and stairs without need for assistance or DME. Pt educated on spinal precautions, use of brace, progressive walking program, and car transfers with pt reporting understanding, no further acute PT needs identified at this time. Thank you for the consult.      Gait Speed: 0.32m/s (Gait speed < 1.5m/s indicates increased risk of falls)     PT Assessment Patient does not need any further PT services  Assistance Needed at Discharge  PRN    Equipment Recommendations None recommended by PT  Recommendations for Other Services       Precautions/Restrictions Precautions Precautions: Back Precaution Booklet Issued: Yes (comment) Precaution Comments: reviewed Required Braces or Orthoses: Spinal Brace Spinal Brace: Thoracolumbosacral orthotic;Applied in sitting position Restrictions Weight Bearing Restrictions Per Provider Order: No        Mobility  Bed Mobility Rolling: Supervision Supine/Sidelying to sit: Supervision Sit to supine/sidelying: Supervision General bed mobility comments: supervision with cues for log roll  Transfers Overall transfer level: Needs assistance Equipment  used: None Transfers: Sit to/from Stand Sit to Stand: Supervision           General transfer comment: supervision for safety in session, no LOB or need for assistance    Ambulation/Gait Ambulation/Gait assistance: Supervision Gait Distance (Feet): 300 Feet Assistive device: None Gait Pattern/deviations: WFL(Within Functional Limits) Gait Speed: Pace WFL General Gait Details: supervision for safety, no overt LOB or need for assistance  Home Activity Instructions Home Activity Instructions: car transfer, progressive walking program, spinal precautions  Stairs Stairs: Yes Stairs assistance: Supervision Stair Management: One rail Right, Alternating pattern, Forwards Number of Stairs: 5    Modified Rankin (Stroke Patients Only)        Balance Overall balance assessment: Mild deficits observed, not formally tested Sitting-balance support: No upper extremity supported Sitting balance-Leahy Scale: Good     Standing balance support: No upper extremity supported Standing balance-Leahy Scale: Good            Pertinent Vitals/Pain PT - Brief Vital Signs All Vital Signs Stable: Yes Pain Assessment Pain Assessment: Faces Faces Pain Scale: Hurts a little bit Pain Location: back Pain Descriptors / Indicators: Discomfort, Grimacing Pain Intervention(s): Limited activity within patient's tolerance, Monitored during session, Repositioned     Home Living Family/patient expects to be discharged to:: Private residence Living Arrangements: Spouse/significant other Available Help at Discharge: Available 24 hours/day Home Environment: Stairs to enter;Rail - right  Stairs-Number of Steps: 2 Home Equipment: Shower seat   Additional Comments: raised toilet seat, walk in shower    Prior Function Level of Independence: Independent Comments: no use of DME, no falls. does yardwork    UE/LE Assessment   UE ROM/Strength/Tone/Coordination: [facility]  LE  ROM/Strength/Tone/Coordination: WFL (mild deficit LLE, but no numbness, tingling)      Communication   Communication Communication: No apparent difficulties Cueing Techniques: Verbal cues     Cognition Overall Cognitive Status: Appears within functional limits for tasks assessed/performed        PT Visit Diagnosis Unsteadiness on feet (R26.81);Pain    No Skilled PT All education completed;Patient will have necessary level of assist by caregiver at discharge;Patient is supervision for all activity/mobility    AMPAC 6 Clicks Help needed turning from your back to your side while in a flat bed without using bedrails?: None Help needed moving from lying on your back to sitting on the side of a flat bed without using bedrails?: None Help needed moving to and from a bed to a chair (including a wheelchair)?: None Help needed standing up from a chair using your arms (e.g., wheelchair or bedside chair)?: None Help needed to walk in hospital room?: A Little Help needed climbing 3-5 steps with a railing? : A Little 6 Click Score: 22      End of Session Equipment Utilized During Treatment: Gait belt;Back brace Activity Tolerance: Patient tolerated treatment well Patient left: in bed;with call bell/phone within reach;with family/visitor present Nurse Communication: Mobility status PT Visit Diagnosis: Unsteadiness on feet (R26.81);Pain Pain - part of body:  (back)     Time: 6578-4696 PT Time Calculation (min) (ACUTE ONLY): 16 min  Charges:   PT Evaluation $PT Eval Low Complexity: 1 Low      Vickki Muff, PT, DPT   Acute Rehabilitation Department Office [phone] Secure Chat Communication Preferred  Ronnie Derby  02/09/2023, 9:23 AM

## 2023-02-09 NOTE — Progress Notes (Signed)
    Patient looks excellent Minimal to no back pain Patient reports resolution of bilateral leg pain   Physical Exam: Vitals:   02/08/23 2329 02/09/23 0356  BP: 137/79 136/83  Pulse: 100 95  Resp: 20 20  Temp: (!) 97.4 F (36.3 C) 97.7 F (36.5 C)  SpO2: 100% 100%    Dressing in place NVI  POD #1 s/p L2/3 revision decompression and fusion, doing excellent  - up with PT/OT, encourage ambulation - Percocet for pain if needed, Robaxin for muscle spasms - d/c home today with f/u in 2 weeks

## 2023-02-09 NOTE — Plan of Care (Signed)
Pt doing well. Pt and wife given D/C instructions with verbal understanding. Rx's were sent to the pharmacy by MD. Pt's incision is clean and dry with no sign of infection. Pt's IV was removed prior to D/C. Pt D/C'd home via walking per MD order. Pt is stable @ D/C and has no other needs at this time. Holli Humbles, RN

## 2023-02-15 ENCOUNTER — Encounter (HOSPITAL_COMMUNITY): Payer: Self-pay | Admitting: Orthopedic Surgery

## 2023-02-22 NOTE — Discharge Summary (Signed)
Patient ID: Joshua Alvarado MRN: [id number] DOB/AGE: April 02, 1950 73 y.o.  Admit date: 02/08/2023 Discharge date: 02/09/2023  Admission Diagnoses:  Principal Problem:   Radiculopathy, lumbar region   Discharge Diagnoses:  Same  Past Medical History:  Diagnosis Date   Arthritis    Cancer (HCC)    squamous right ear   Depression    GERD (gastroesophageal reflux disease)    Headache    History of hiatal hernia    " a long time ago - it does't bother me anymore."   Hypercholesteremia    Hypertension    no BP meds now   Irritable bowel syndrome     Surgeries: Procedure(s): LEFT-SIDED LUMBAR TWO - LUMBAR THREE TRANSFORAMINAL LUMBAR INTERBODY FUSION AND DECOMPRESSION WITH INSTRUMENTATION AND ALLOGRAFT on 02/08/2023   Consultants: none  Discharged Condition: Improved  Hospital Course: Godfrey Tritschler is an 73 y.o. male who was admitted 02/08/2023 for operative treatment of Radiculopathy, lumbar region. Patient has severe unremitting pain that affects sleep, daily activities, and work/hobbies. After pre-op clearance the patient was taken to the operating room on 02/08/2023 and underwent  Procedure(s): LEFT-SIDED LUMBAR TWO - LUMBAR THREE TRANSFORAMINAL LUMBAR INTERBODY FUSION AND DECOMPRESSION WITH INSTRUMENTATION AND ALLOGRAFT.    Patient was given perioperative antibiotics:  Anti-infectives (From admission, onward)    Start     Dose/Rate Route Frequency Ordered Stop   02/08/23 1700  ceFAZolin (ANCEF) IVPB 2g/100 mL premix        2 g 200 mL/hr over 30 Minutes Intravenous Every 8 hours 02/08/23 1323 02/09/23 0107   02/08/23 0615  ceFAZolin (ANCEF) IVPB 2g/100 mL premix        2 g 200 mL/hr over 30 Minutes Intravenous On call to O.R. 02/08/23 4696 02/08/23 0920        Patient was given sequential compression devices, early ambulation to prevent DVT.  Patient benefited maximally from hospital stay and there were no complications.    Recent vital signs: BP (!)  147/74 (BP Location: Right Arm)   Pulse 100   Temp (!) 97.5 F (36.4 C) (Oral)   Resp 20   Ht 6' (1.829 m)   Wt 93 kg   SpO2 100%   BMI 27.80 kg/m    Discharge Medications:   Allergies as of 02/09/2023       Reactions   Crestor [rosuvastatin Calcium] Diarrhea   Lipitor [atorvastatin]    Elevated liver enzymes   Zocor [simvastatin]    Stomach cramps        Medication List     TAKE these medications    buPROPion 150 MG 24 hr tablet Commonly known as: WELLBUTRIN XL Take 150 mg by mouth every morning.   losartan 25 MG tablet Commonly known as: COZAAR Take 12.5 mg by mouth daily.   methocarbamol 500 MG tablet Commonly known as: ROBAXIN Take 1 tablet (500 mg total) by mouth every 6 (six) hours as needed for muscle spasms.   Nexlizet 180-10 MG Tabs Generic drug: Bempedoic Acid-Ezetimibe Take 1 tablet by mouth daily.   oxyCODONE-acetaminophen 5-325 MG tablet Commonly known as: PERCOCET/ROXICET Take 1-2 tablets by mouth every 4 (four) hours as needed for severe pain (pain score 7-10).   topiramate 50 MG tablet Commonly known as: TOPAMAX Take 1 tablet (50 mg total) by mouth 2 (two) times daily.   Viberzi 75 MG Tabs Generic drug: Eluxadoline Take 75 mg by mouth in the morning and at bedtime.  Diagnostic Studies: DG Lumbar Spine 2-3 Views Result Date: 02/08/2023 CLINICAL DATA:  Elective surgery. EXAM: LUMBAR SPINE - 2-3 VIEW COMPARISON:  MRI 11/28/2022 FINDINGS: Five fluoroscopic spot views of the lumbar spine obtained in the operating room. Posterior rod with intrapedicular screw fusion L2-L3 with interbody spacer. Fluoroscopy time 46.4 seconds, dose 38.85 mGy. IMPRESSION: Intraoperative fluoroscopy during lumbar fusion. Electronically Signed   By: Narda Rutherford M.D.   On: 02/08/2023 14:56   DG Lumbar Spine 1 View Result Date: 02/08/2023 CLINICAL DATA:  Elective surgery.  Intra op for localization. EXAM: LUMBAR SPINE - 1 VIEW COMPARISON:  MRI 11/28/2022  FINDINGS: Portable cross-table lateral view of the lumbar spine obtained in the operating room. Surgical instruments localize posteriorly at the L2-L3 and L4 levels. IMPRESSION: Intraoperative localization during lumbar spine surgery. Electronically Signed   By: Narda Rutherford M.D.   On: 02/08/2023 14:54   DG C-Arm 1-60 Min-No Report Result Date: 02/08/2023 Fluoroscopy was utilized by the requesting physician.  No radiographic interpretation.   DG C-Arm 1-60 Min-No Report Result Date: 02/08/2023 Fluoroscopy was utilized by the requesting physician.  No radiographic interpretation.   DG C-Arm 1-60 Min-No Report Result Date: 02/08/2023 Fluoroscopy was utilized by the requesting physician.  No radiographic interpretation.    Disposition: Discharge disposition: 01-Home or Self Care        POD #1 s/p L2/3 revision decompression and fusion, doing excellent   - up with PT/OT, encourage ambulation - Percocet for pain if needed, Robaxin for muscle spasms -Scripts for pain sent to pharmacy electronically  -D/C instructions sheet printed and in chart -D/C today  -F/U in office 2 weeks   Signed: Eilene Ghazi Fermina Mishkin 02/22/2023, 11:56 AM

## 2023-09-27 NOTE — Progress Notes (Unsigned)
 NEUROLOGY FOLLOW UP OFFICE NOTE  Niclas Markell [id number]  Assessment/Plan:   Primary cough headache Tension-type headache, not intractable Tremor - probable essential tremor but cannot establish diagnosis until symptoms ongoing for 3 years.  Exhibits no signs or symptoms of Parkinson's disease on exam.  Topiramate  50mg  twice daily  Limit use of pain relievers to no more than 9 days out of the month to prevent risk of rebound or medication-overuse headache. Keep headache diary Continue to monitor tremor Follow up one year   Subjective:  Slayter Moorhouse is a 73 year old male who follows up for primary cough headache.   UPDATE: Headaches: Doing well Intensity:  Usually 3-4/10 Duration:  10 minutes  Frequency:  May have some for 2 to 3 consecutive days and then headache-free for 2 to 3 months.  Last spell was 4 months ago.   Tremor: Sometimes notes tremor in the right hand mainly when he picks up with a coffee cup and bringing up to his mouth.  If he grips tighter, it stops.  Started about 8 months ago.  Doesn't happen with utensils or writing.  His father had tremor in his 30s.  His maternal great grandmother had Parkinson's disease.  Years ago for awhile, he had a vivid dream of being chased but that resolved many years ago.    Current NSAIDS:  none Current analgesics:  none Current triptans:  none Current ergotamine:  none Current anti-emetic:  none Current muscle relaxants:  none Current anti-anxiolytic:  none Current sleep aide:  none Current Antihypertensive medications:  losartan  Current Antidepressant medications:  Wellbutrin  Current Anticonvulsant medications:  topiramate  50mg  twice daily Current anti-CGRP:  none Current Vitamins/Herbal/Supplements:  none Current Antihistamines/Decongestants:  none Other therapy:  none   Caffeine:  Decaf tea Alcohol:  occasional Smoker:  no Diet:  Hydrates with water Exercise:  Not routine Depression:  no; Anxiety:   no Other pain:  no Sleep hygiene:  okay   HISTORY: Headaches started on September 22 or 23, 2017.  He denies any preceding event that may have triggered this headache.  Initially, it was right periorbital that subsequently involved the entire head, radiating from the back to front.  He has a constant dull holocephalic headache.  Bending over makes it worse.  There is no neck pain.  However, he has severe intermittent headaches.  They occasionally occur spontaneously, but almost always are triggered by coughing or sneezing.  When it occurs, he needs to lay down and it resolves in 1 to 2 hours. He has associated photophobia but no nausea, vomiting, phonophobia or visual disturbance or unilateral numbness or weakness.  It occurs approximately 5 to 6 days a month.  Headache had actually resolved for 2 days following a lumbar puncture.   Past NSAIDS:  Indomethacin trial was ineffective. Past analgesics:  acetaminophen , BC/Goody powder, Excedrin Past abortive triptans:  no Past muscle relaxants:  cyclobenzaprine Past anti-emetic:  no Past antihypertensive medications: metoprolol Past antidepressant medications:  bupropion , escitalopram Past anticonvulsant medications:  zonisamide 200mg  Past vitamins/Herbal/Supplements:  no Past antihistamines/decongestants:  no Other past therapies:  trigger point injections and nerve blocks were ineffective.   Occasional tension type headaches prior to this, but nothing significant. Family history of headache:  no   Workup: I  MRI of brain with and without contrast from 08/30/16 was personally reviewed and demonstrated scattered nonspecific hyperintense foci in the cerebral white matter. II  He underwent lumbar puncture on 11/03/16 to assess for low intracranial pressure.  Opening pressure of 13.5 cm H2O and closing pressure of 7 cm H2O.  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  PAST MEDICAL HISTORY: Past Medical History:  Diagnosis Date   Arthritis    Cancer (HCC)    squamous right ear   Depression    GERD (gastroesophageal reflux disease)    Headache    History of hiatal hernia     a long time ago - it does't bother me anymore.   Hypercholesteremia    Hypertension    no BP meds now   Irritable bowel syndrome     MEDICATIONS: Current Outpatient Medications on File Prior to Visit  Medication Sig Dispense Refill   buPROPion  (WELLBUTRIN  XL) 150 MG 24 hr tablet Take 150 mg by mouth every morning.     Eluxadoline  (VIBERZI ) 75 MG TABS Take 75 mg by mouth in the morning and at bedtime.     losartan  (COZAAR ) 25 MG tablet Take 12.5 mg by mouth daily.     methocarbamol  (ROBAXIN ) 500 MG tablet Take 1 tablet (500 mg total) by mouth every 6 (six) hours as needed for muscle spasms. 30 tablet 2   NEXLIZET 180-10 MG TABS Take 1 tablet by mouth daily.     oxyCODONE -acetaminophen  (PERCOCET/ROXICET) 5-325 MG tablet Take 1-2 tablets by mouth every 4 (four) hours as needed for severe pain (pain score 7-10). 30 tablet 0   topiramate  (TOPAMAX ) 50 MG tablet Take 1 tablet (50 mg total) by mouth 2 (two) times daily. 180 tablet 3   No current facility-administered medications on file prior to visit.     ALLERGIES: Allergies  Allergen Reactions   Crestor [Rosuvastatin Calcium] Diarrhea   Lipitor [Atorvastatin]     Elevated liver enzymes   Zocor [Simvastatin]     Stomach cramps    FAMILY HISTORY: Family History  Problem Relation Age of Onset   Cancer Mother    Parkinson's disease Father       Objective:  Blood pressure (!) 149/80,  pulse 65, height 6' (1.829 m), weight 205 lb (93 kg), SpO2 100%. General: No acute distress.  Patient appears well-groomed.   Head:  Normocephalic/atraumatic Neck:  Supple.  No paraspinal tenderness.  Full range of motion. Heart:  Regular rate and rhythm. Neuro:  Alert and oriented.  Speech fluent and not dysarthric.  Language intact.  CN II-XII intact.  Muscle strength 5/5 throughout.  Bilateral postural and intention tremor in hands.  No resting tremor.  No bradykinesia or rigidity.  Sensation to light touch intact.  Deep tendon reflexes 2+ throughout, toes downgoing.  Gait upright stance with normal stride and arm swing.  Romberg negative.    Juliene Dunnings, DO  CC: Noelle Redmon, PA

## 2023-09-28 ENCOUNTER — Ambulatory Visit: Payer: Medicare Other | Admitting: Neurology

## 2023-09-28 ENCOUNTER — Encounter: Payer: Self-pay | Admitting: Neurology

## 2023-09-28 VITALS — BP 148/77 | HR 65 | Ht 72.0 in | Wt 205.0 lb

## 2023-09-28 DIAGNOSIS — G4483 Primary cough headache: Secondary | ICD-10-CM

## 2023-09-28 DIAGNOSIS — G44219 Episodic tension-type headache, not intractable: Secondary | ICD-10-CM

## 2023-09-28 DIAGNOSIS — R251 Tremor, unspecified: Secondary | ICD-10-CM | POA: Diagnosis not present

## 2023-09-28 MED ORDER — TOPIRAMATE 50 MG PO TABS: 50.0000 mg | ORAL_TABLET | Freq: Two times a day (BID) | ORAL | 3 refills | Status: AC

## 2024-09-27 ENCOUNTER — Ambulatory Visit: Admitting: Neurology
